# Patient Record
Sex: Male | Born: 1965
Health system: Southern US, Community
[De-identification: ages and names within clinical notes are randomized; demographics above are authoritative.]

## PROBLEM LIST (undated history)

## (undated) DIAGNOSIS — T7840XA Allergy, unspecified, initial encounter: Secondary | ICD-10-CM

## (undated) DIAGNOSIS — K219 Gastro-esophageal reflux disease without esophagitis: Secondary | ICD-10-CM

## (undated) HISTORY — PX: CERVICAL FUSION: SHX112

## (undated) HISTORY — DX: Allergy, unspecified, initial encounter: T78.40XA

## (undated) HISTORY — DX: Gastro-esophageal reflux disease without esophagitis: K21.9

## (undated) HISTORY — PX: HERNIA REPAIR: SHX51

## (undated) HISTORY — PX: CERVICAL DISCECTOMY: SHX98

---

## 1998-04-27 ENCOUNTER — Ambulatory Visit (HOSPITAL_COMMUNITY): Admission: RE | Admit: 1998-04-27 | Discharge: 1998-04-27 | Payer: Self-pay | Admitting: *Deleted

## 1998-04-27 ENCOUNTER — Encounter: Payer: Self-pay | Admitting: *Deleted

## 1998-07-05 ENCOUNTER — Ambulatory Visit (HOSPITAL_COMMUNITY): Admission: RE | Admit: 1998-07-05 | Discharge: 1998-07-05 | Payer: Self-pay | Admitting: Neurosurgery

## 1998-07-05 ENCOUNTER — Encounter: Payer: Self-pay | Admitting: Neurosurgery

## 1999-02-05 ENCOUNTER — Ambulatory Visit (HOSPITAL_COMMUNITY): Admission: RE | Admit: 1999-02-05 | Discharge: 1999-02-05 | Payer: Self-pay | Admitting: Neurosurgery

## 1999-02-05 ENCOUNTER — Encounter: Payer: Self-pay | Admitting: Neurosurgery

## 1999-04-07 ENCOUNTER — Ambulatory Visit (HOSPITAL_COMMUNITY): Admission: RE | Admit: 1999-04-07 | Discharge: 1999-04-07 | Payer: Self-pay | Admitting: Neurosurgery

## 1999-04-07 ENCOUNTER — Encounter: Payer: Self-pay | Admitting: Neurosurgery

## 1999-04-21 ENCOUNTER — Encounter: Payer: Self-pay | Admitting: Neurosurgery

## 1999-04-21 ENCOUNTER — Ambulatory Visit (HOSPITAL_COMMUNITY): Admission: RE | Admit: 1999-04-21 | Discharge: 1999-04-21 | Payer: Self-pay | Admitting: Neurosurgery

## 1999-05-03 ENCOUNTER — Encounter: Payer: Self-pay | Admitting: Neurosurgery

## 1999-05-03 ENCOUNTER — Ambulatory Visit (HOSPITAL_COMMUNITY): Admission: RE | Admit: 1999-05-03 | Discharge: 1999-05-03 | Payer: Self-pay | Admitting: Neurosurgery

## 1999-07-25 ENCOUNTER — Encounter: Payer: Self-pay | Admitting: Neurosurgery

## 1999-07-27 ENCOUNTER — Encounter: Payer: Self-pay | Admitting: Neurosurgery

## 1999-07-27 ENCOUNTER — Observation Stay (HOSPITAL_COMMUNITY): Admission: RE | Admit: 1999-07-27 | Discharge: 1999-07-28 | Payer: Self-pay | Admitting: Neurosurgery

## 1999-08-25 ENCOUNTER — Encounter: Admission: RE | Admit: 1999-08-25 | Discharge: 1999-08-25 | Payer: Self-pay | Admitting: Neurosurgery

## 1999-08-25 ENCOUNTER — Encounter: Payer: Self-pay | Admitting: Neurosurgery

## 1999-10-04 ENCOUNTER — Encounter: Admission: RE | Admit: 1999-10-04 | Discharge: 1999-10-04 | Payer: Self-pay | Admitting: Neurosurgery

## 1999-10-04 ENCOUNTER — Encounter: Payer: Self-pay | Admitting: Neurosurgery

## 1999-12-04 ENCOUNTER — Ambulatory Visit (HOSPITAL_COMMUNITY): Admission: RE | Admit: 1999-12-04 | Discharge: 1999-12-04 | Payer: Self-pay | Admitting: Neurosurgery

## 1999-12-04 ENCOUNTER — Encounter: Payer: Self-pay | Admitting: Neurosurgery

## 2000-07-03 ENCOUNTER — Encounter: Admission: RE | Admit: 2000-07-03 | Discharge: 2000-07-03 | Payer: Self-pay | Admitting: Neurosurgery

## 2000-07-03 ENCOUNTER — Encounter: Payer: Self-pay | Admitting: Neurosurgery

## 2000-11-03 ENCOUNTER — Encounter: Payer: Self-pay | Admitting: Neurosurgery

## 2000-11-03 ENCOUNTER — Ambulatory Visit (HOSPITAL_COMMUNITY): Admission: RE | Admit: 2000-11-03 | Discharge: 2000-11-03 | Payer: Self-pay | Admitting: Neurosurgery

## 2001-05-06 ENCOUNTER — Ambulatory Visit (HOSPITAL_COMMUNITY): Admission: RE | Admit: 2001-05-06 | Discharge: 2001-05-06 | Payer: Self-pay | Admitting: *Deleted

## 2001-05-06 ENCOUNTER — Encounter: Payer: Self-pay | Admitting: *Deleted

## 2001-09-24 ENCOUNTER — Encounter: Payer: Self-pay | Admitting: Family Medicine

## 2001-09-24 ENCOUNTER — Ambulatory Visit (HOSPITAL_COMMUNITY): Admission: RE | Admit: 2001-09-24 | Discharge: 2001-09-24 | Payer: Self-pay | Admitting: Family Medicine

## 2006-08-09 ENCOUNTER — Ambulatory Visit (HOSPITAL_COMMUNITY): Admission: RE | Admit: 2006-08-09 | Discharge: 2006-08-09 | Payer: Self-pay | Admitting: Neurological Surgery

## 2006-08-30 ENCOUNTER — Ambulatory Visit (HOSPITAL_COMMUNITY): Admission: RE | Admit: 2006-08-30 | Discharge: 2006-08-30 | Payer: Self-pay | Admitting: Neurological Surgery

## 2007-06-14 ENCOUNTER — Encounter: Admission: RE | Admit: 2007-06-14 | Discharge: 2007-06-14 | Payer: Self-pay | Admitting: Neurological Surgery

## 2007-07-17 ENCOUNTER — Ambulatory Visit (HOSPITAL_COMMUNITY): Admission: RE | Admit: 2007-07-17 | Discharge: 2007-07-18 | Payer: Self-pay | Admitting: Neurological Surgery

## 2007-08-19 ENCOUNTER — Encounter: Admission: RE | Admit: 2007-08-19 | Discharge: 2007-08-19 | Payer: Self-pay | Admitting: Neurological Surgery

## 2007-10-21 ENCOUNTER — Encounter: Admission: RE | Admit: 2007-10-21 | Discharge: 2007-10-21 | Payer: Self-pay | Admitting: Neurological Surgery

## 2008-01-20 ENCOUNTER — Encounter: Admission: RE | Admit: 2008-01-20 | Discharge: 2008-01-20 | Payer: Self-pay | Admitting: Neurological Surgery

## 2008-02-12 IMAGING — RF DG CERVICAL SPINE 1V
1 series · 2 of 2 positions shown · non-contrast
Comparison: none

HISTORY: Disc herniation, C6-C7 ACDF

CERVICAL SPINE ONE VIEW:
Single crosstable lateral digital C-arm fluoroscopic image obtained
intraoperatively.
Prior fusion of C5-C6.
Anterior plate and screws identified at fusion of C6-C7.
Bone plug in place.
Minimal spur formation C4-C5.
T1 not visualized.

[Series 1: run · 2 of 2 slices shown]
[im 1/2]
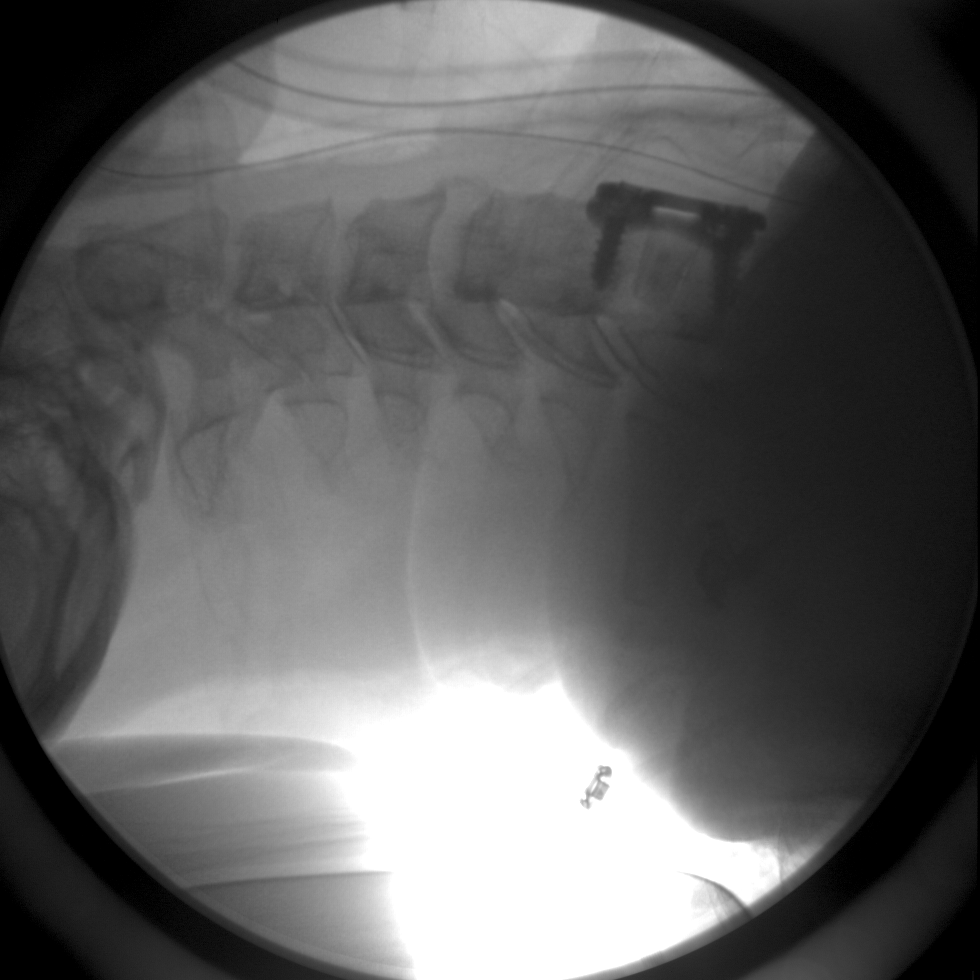
[im 2/2]
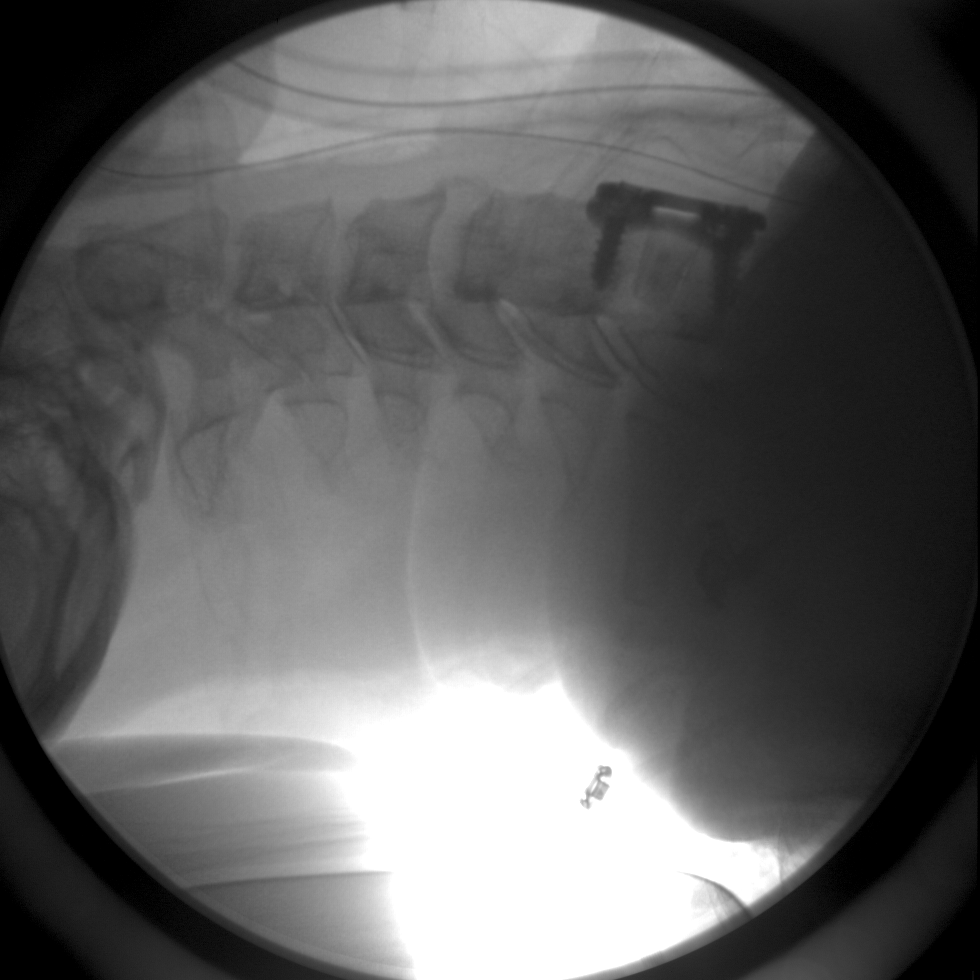

[2 of 2 positions shown; findings below may reference images not displayed]

IMPRESSION: Anterior fusion C6-C7.

## 2009-06-14 ENCOUNTER — Encounter: Admission: RE | Admit: 2009-06-14 | Discharge: 2009-06-14 | Payer: Self-pay | Admitting: Surgery

## 2009-07-14 ENCOUNTER — Ambulatory Visit (HOSPITAL_COMMUNITY): Admission: RE | Admit: 2009-07-14 | Discharge: 2009-07-14 | Payer: Self-pay | Admitting: Surgery

## 2009-08-09 ENCOUNTER — Ambulatory Visit (HOSPITAL_COMMUNITY): Admission: RE | Admit: 2009-08-09 | Discharge: 2009-08-09 | Payer: Self-pay | Admitting: Surgery

## 2010-06-09 ENCOUNTER — Ambulatory Visit: Payer: Self-pay | Admitting: General Practice

## 2010-09-15 LAB — BASIC METABOLIC PANEL
Chloride: 103 mEq/L (ref 96–112)
GFR calc non Af Amer: >60 mL/min (ref >60)
Glucose, Bld: 91 mg/dL (ref 70–99)

## 2010-09-15 LAB — CBC
HCT: 40.7 % (ref 39.0–52.0)
MCV: 86.9 fL (ref 78.0–100.0)
Platelets: 212 10*3/uL (ref 150–400)
RBC: 4.68 MIL/uL (ref 4.22–5.81)
WBC: 5.1 10*3/uL (ref 4.0–10.5)

## 2010-09-15 LAB — DIFFERENTIAL
Basophils Relative: 1 % (ref 0–1)
Lymphocytes Relative: 28 % (ref 12–46)
Lymphs Abs: 1.5 10*3/uL (ref 0.7–4.0)
Monocytes Absolute: 0.3 10*3/uL (ref 0.1–1.0)
Monocytes Relative: 6 % (ref 3–12)
Neutrophils Relative %: 64 % (ref 43–77)

## 2010-11-08 NOTE — Op Note (Signed)
NAMESABRI, TEAL NO.:  [id number]   MEDICAL RECORD NO.:  [id number]          PATIENT TYPE:  AMB   LOCATION:  SDS                          FACILITY:  [facility]   PHYSICIAN:  Tia Alert, MD     DATE OF BIRTH:  08-30-1965   DATE OF PROCEDURE:  07/17/2007  DATE OF DISCHARGE:                               OPERATIVE REPORT   PREOPERATIVE DIAGNOSIS:  Adjacent level spondylosis with neural  foraminal stenosis C6-C7 status post ACDF with plating C5-C6 and C7-T1.   POSTOPERATIVE DIAGNOSIS:  Adjacent level spondylosis with neural  foraminal stenosis C6-C7 status post ACDF with plating C5-C6 and C7-T1.   PROCEDURE:  1. Cervical re-exploration with removal of anterior cervical hardware      C5-C6 and C7-T1.  2. Anterior cervical discectomy C6-C7 for central canal nerve root      decompression.  3. Anterior cervical arthrodesis C6-C7 utilizing a 7 mm      corticocancellous allograft.  4. Anterior cervical plating C6-C7 utilizing a 27 mm Atlantis Venture      plate.   SURGEON:  Tia Alert, M.D.   ASSISTANT:  Donalee Citrin, M.D.   ANESTHESIA:  General endotracheal.   COMPLICATIONS:  None apparent.   INDICATIONS FOR PROCEDURE:  Mr. Force is a pleasant 45 year old  gentleman who underwent two previous ACDs at C5-C6 and at C7-T1 through  a left sided approach years ago with placement of CSLP plates.  He had  adjacent level spondylosis at C6-C7 and complained of neck pain with  left greater than right arm pain.  He had a CT myelogram which showed  cutoff of the nerve root sleeve at C6-C7 on the left with spondylosis at  that level and some central canal stenosis.  I recommended an anterior  cervical re-exploration with removal of the previous plates and ACDF  with plating at C6-C7.  He understood the risks, benefits, and expected  outcome, and wished to proceed.   DESCRIPTION OF PROCEDURE:  The patient was taken to operating room and  after induction of adequate  generalized endotracheal anesthesia, he was  placed in a supine position.  His left anterior cervical region was  prepped with DuraPrep and draped in the usual sterile fashion.  5 mL  local anesthesia was injected and a transverse incision was made between  the old incision and carried down to the with platysma which was  elevated, opened and undermined with Metzenbaum scissors.  I then  dissected in a plane medial to the sternocleidomastoid muscle and  internal carotid artery and lateral to the trachea and esophagus to  expose the old plates at Q0-H4 and C7-T1.  These were removed by  removing the locking screws and then the screws, themselves, and then  picking the plates with hemostats.  We then incised the disc space at C6-  C7.  We took down the longus colli muscles and placed the Shadowline  retractors under these to expose C6-C7.  The initial discectomy was done  with pituitary rongeurs and curved Karlin curettes.  We then used the  high speed drill  to drill the endplates to prepare for later  arthrodesis.  We drilled down to the level of the posterior longitudinal  ligament.  The operating microscope was brought onto the field.  We then  opened the posterior longitudinal ligament with a nerve hook and removed  it in a circumferential fashion while undercutting the bodies of C6 and  C7. Bilateral foraminotomies were performed. The nerve roots had a low  takeoff and followed the pedicle.  Therefore, we marched along the  superior endplate of C7, identified the pedicles, bilaterally and then  marched along the nerve roots distally into the foramina.  We had very  wide generous foraminotomies and long to expansile areas of the nerve  root visual to [place].  We then palpated with the nerve hook into the  foramina.  It was completely open and free.  We, therefore, irrigated  with saline solution.  We inspected our central decompression and it  looked quite good.  The dura was no longer  pushed away from [place]. We had  undercut the bodies to decompress the central canal.  We then measured  the interspace to be 7 mm and placed a 7 mm corticocancellous allograft  in the interspace at C6-C7.  We then used 27 mm Atlantis Venture plate  and placed two Rescue screws into the vertebral bodies of C6 and drilled  two new holes in C7 and placed 13 mm variable angle screws there.  Once  we achieved fixation, we irrigated with saline solution containing  bacitracin, dried all bleeding points with bipolar cautery, and placed a  7 flat Blake drain through a separate stab incision and once meticulous  hemostasis was achieved, closed the platysma with 3-0 Vicryl, closed the  subcuticular tissue with 3-0 Vicryl, and closed the skin with Benzoin  and Steri-Strips.  The drapes were removed.  A sterile dressing was  applied.  The patient was awakened from general anesthesia and  transferred to the recovery room in stable condition.  At the end of the  procedure, all sponge, needle and instrument counts were correct.      Tia Alert, MD  Electronically Signed     DSJ/MEDQ  D:  07/17/2007  T:  07/17/2007  Job:  [id number]

## 2010-11-11 NOTE — H&P (Signed)
[place]. [facility]  Patient:    Bryan Mccann                         MRN: [id number] Proc. Date: 07/27/99 Adm. Date:  [id number] Attending:  Coletta Memos                         History and Physical  DIAGNOSES:  1. Displaced disc, C7-T1 left.             2. Left C8 radiculopathy.  HISTORY OF PRESENT ILLNESS:   Bryan Mccann is a 45 year old gentleman who presented to me originally for pain in his left upper extremity along with numbness and occasional weakness in November 1999.  Initial MRI showed that he had a large herniated disc at C5-6 on the right side but nothing which was so impressive on the left side as to cause his pain.  He had no right-sided pain initially and then started to have right-sided pain.  For that reason I brought him to the hospital on July 05, 1998 and performed an anterior cervical diskectomy, fusion of C5-6 ith allograft and Synthes plating.  Postoperatively, he did actually quite well and the left arm pain also went away.  He returned to me, however, in October and stated that he was having pain again in his left upper extremity.  Repeat MRI done showed a very small disc herniation on the left side at C7-T1.  He had numbness in his  entire hand and pain which radiated down the neck, back of his arm and forearm, and into the hand.  What we then decided that he should undergo epidural steroid injections of the C8 nerve root.  Although they did not provide great relief, they did elicit the exact pain which he was complaining of.  For that reason I felt hat we could isolate to a very high degree of probability that the pain was from the C8 nerve root and that the small disc herniation was the cause of it.  I, therefore, recommended and he agreed to undergo an anterior cervical diskectomy and fusion at C7-T1.  PAST MEDICAL HISTORY:  Significant only for right upper extremity pain and anterior cervical diskectomy  at C5-6 done July 05, 1998.  ALLERGIES:  No known drug allergies.  REVIEW OF SYSTEMS:  Positive only for the neck and left upper extremity pain. Denies constitutional, eye, ear, nose, throat, mouth, cardiovascular, respiratory, genitourinary, skin, neurologic, psychiatric, endocrinologic, hematologic, or allergic problems.  FAMILY HISTORY:  Mother age 10 is in good health.  Father is age 83 and in good  health.  Hypertension and heart disease present in the family history.  SOCIAL HISTORY:  He does not smoke, does drink socially.  He is 5 feet 10 inches tall and is employed as a IT sales professional.  He is married with children.  PHYSICAL EXAMINATION:  NEUROLOGICAL EXAM:  Bryan Mccann had full strength in the upper extremities. Reflexes 2+ in the upper extremities and lower extremities.  Muscle tone, bulk nd coordination normal.  Gait was normal.  Cranial nerves II through XII were examined and normal.  NECK:  He had no cervical masses or bruits.  LUNGS:  Lung fields were clear.  HEART:  Regular rate and rhythm.  ABDOMEN:  Soft and nontender.  PLAN:  Bryan Mccann will be admitted today and taken to the operating room for an  anterior cervical diskectomy and fusion  of C7-T1.  Risks of the procedure including bleeding, infection, no pain relief, worsening of pain, need for further operations, fusion failure, hardware failure, nerve root damage were explained. He understands and wishes to proceed. DD:  07/27/99 TD:  07/27/99 Job: [id number]

## 2010-11-11 NOTE — Op Note (Signed)
[place]. [facility]  Patient:    Bryan Mccann                         MRN: [id number] Proc. Date: 07/27/99 Adm. Date:  [id number] Attending:  Coletta Memos                           Operative Report  PREOPERATIVE DIAGNOSES: 1. Displaced disk C7-T1 left side. 2. Left C8 radiculopathy.  POSTOPERATIVE DIAGNOSES: 1. Displaced disk C7-T1 left side. 2. Left C8 radiculopathy.  PROCEDURE:  Anterior cervical diskectomy C7-T1, anterior cervical arthrodesis C7-T1 with anterior plating and allograft 16 mm small stature Synthes plate used with  14 mm screws.  COMPLICATIONS:  None.  SURGEON:  Kyle L. Franky Macho, M.D.  ASSISTANT:  Danae Orleans. Venetia Maxon, M.D.  INDICATIONS:  Bryan Mccann is a 45 year old gentleman, who presents with pain n his left upper extremity, which he has had on an off and on basis now for approximately a year.  He has a very small disk space ______ C7-T1.  Nerve root  injections identified his ______ as being in a ______ distribution and caused the C8 radiculopathy.  I recommended and he agreed to undergo an anterior cervical diskectomy and fusion.  OPERATIVE NOTE:  Bryan Mccann was brought to the operating room.  He was intubated, placed under general anesthesia without difficulty.  He was placed in slight extension with 10 pounds of cervical traction using a chin strap.  His neck was  then prepped.  I infiltrated incision with 0.5% lidocaine, 1:200,000 strength epinephrine.  He was draped in a sterile fashion.  I opened the skin with a 10 blade starting in the midline and taken to the medial border of the sternocleidomastoid about a fingerbreadth above the sternal notch.   I took this to the platysma.  I opened the platysma in a horizontal fashion.  I controlled bleeding in the subcutaneous tissues with bipolar cautery.  After opening the platysma, I dissected both rostrally and caudally to create more room above the  platysma.  Then,  identified the sternocleidomastoid.  I dissected between the sternocleidomastoid and the medial strap muscles.  I created a plane going medial to the carotid artery.  I was able to palpate the anterior portion of the cervical spine.  Using a peanut, I removed soft tissue.  Placed hand-held retractors and a needle into the disk space there.  The x-ray showed the needle to be at C7-T1. I then proceeded to reflect the longus colli muscles laterally and both the right and left sides.  Placed self-retaining retractor at C7-T1.  I placed two distraction pins, one at C7 and one in T1 and used the pin distractor.  I opened the disk space with a #15 blade, removed disk material in a progressive fashion with curets and pituitary rongeurs.  I drilled out some of the bony prominences and osteophytes  present at that level.  I removed disk material and did remove a free fragment f disk at the C7-T1 disk space on the left side.  Irrigated the wound.  After satisfied with myself that the nerve roots had been decompressed by removing the posterior longitudinal ligament and the chosen of thecal sac, I then placed a 7 mm allograft into the disk space.  Placed 15 mm plate with two screws in C7, two screws in T1.  Took another x-ray, which confirmed my location at C7-T1.  Irrigated the wound.  I then closed the wound in layered fashion reapproximating the platysma with Vicryl sutures, the skin with Steri-Strips.  Patient had a sterile dressing applied.  Bryan Mccann was awakened and was moving all extremities well.  The weight was taken off prior to placing the plate. DD:  07/27/99 TD:  07/27/99 Job: [id number]

## 2010-11-11 NOTE — Discharge Summary (Signed)
[place]. [facility]  Patient:    Bryan Mccann                         MRN: [id number] Adm. Date:  [id number] Disc. Date: [id number] Attending:  Coletta Memos Dictator:   Mena Goes. Franky Macho, M.D.                           Discharge Summary  ADMISSION DIAGNOSES: 1. Displaced disc, C7-T1, left. 2. Left C8 radiculopathy.  DISCHARGE DIAGNOSES: 1. Displaced disc, C7-T1, left. 2. Left C8 radiculopathy.  DISCHARGE STATUS:  Alive and well.  PROCEDURES:  Anterior cervical diskectomy fusion, C7-T1, with 7 mm Allograft and 16 mm small stature Synthes plate.  COMPLICATIONS:  None.  INDICATIONS:  [name] is a 45 year old gentleman who presented with left upper extremity pain to me in November 1999.  He had a displaced disc at C5-6 which was on the right side.  Initially, he had no right-sided pain, and the left-sided pain did subside slightly.  The right-sided pain started soon after he first came to see me.  I performed an anterior cervical diskectomy and fusion at C5-6 for right upper extremity pain.  Unfortunately, the left-sided pain recurred in the  summer of 2000.  Repeat MRI showed a C7-T1 lesion with a very small disc herniation on that side, left side.  [name] underwent cervical epidural steroid shots.  While the shots did not help with the pain, they did to a reasonable degree of certainty, isolate his pain through the C8 distribution on the left side. Because of that and his lack of responsiveness to the steroids and the findings on the RI, I recommended, and he agreed to undergo, a left C7-T1 anterior cervical diskectomy and fusion.  HOSPITAL COURSE:  [name] was admitted, had his procedure done in an uncomplicated fashion.  Postoperatively, he had 5/5 strength in the upper extremities.  He had clean intact wound without signs of infection at discharge. He was voiding, ambulating, and tolerating a regular diet.  His voice  was normal.  DISCHARGE MEDICATIONS:  [name] will be discharged home on Vicodin for pain medication.  ACTIVITY:  Given instructions of no driving, bending, twisting, or lifting.  FOLLOWUP:  He will be sent home with a return appointment to see me in approximately 2-3 weeks. DD:  07/28/99 TD:  07/28/99 Job: [id number]

## 2010-11-11 NOTE — Op Note (Signed)
NAMERAMIZ, TURPIN NO.:  [id number]   MEDICAL RECORD NO.:  [id number]          PATIENT TYPE:  AMB   LOCATION:  SDS                          FACILITY:  [facility]   PHYSICIAN:  Tia Alert, MD     DATE OF BIRTH:  1965-10-25   DATE OF PROCEDURE:  08/09/2006  DATE OF DISCHARGE:                               OPERATIVE REPORT   PREOPERATIVE DIAGNOSIS:  Lumbar disk herniation, L4-5 on the left, with  a left L5 radiculopathy.   POSTOPERATIVE DIAGNOSIS:  Lumbar disk herniation, L4-5 on the left, with  a left L5 radiculopathy.   PROCEDURE:  Lumbar hemilaminectomy, medial facetectomy and foraminotomy,  L4-5, on left, followed by microdiskectomy at L4-5 on left utilizing  microscopic dissection.   SURGEON:  Marikay Alar, MD   ASSISTANT:  Aliene Beams, MD   ANESTHESIA:  General endotracheal.   COMPLICATIONS:  None apparent.   INDICATIONS FOR PROCEDURE:  Mr. Dykman is a 45 year old male who has a  long history of back pain with radiation into the left lower extremity.  He had an MRI which showed a herniated disk at L4-5 on the left.  He  tried medical management for quite some time without significant relief  including epidural steroid injections; he failed medical management and  I recommended a microdiskectomy at L4-5 on the left side.  He understood  the risks, benefits, and expected outcome and wished to proceed.   DESCRIPTION OF PROCEDURE:  The patient was taken to the operating room  and after induction of adequate generalized endotracheal anesthesia, he  was rolled into the prone position on the Wilson frame and all pressure  points were padded.  His lumbar region was prepped with DuraPrep and  then draped in the usual sterile fashion.  Five milliliters of local  anesthesia were injected and a small dorsal midline incision was made  and carried down to the lumbosacral fascia.  The fascia was opened on  the left side and taken down in a subperiosteal fashion  to expose L4-5  on the left side.  The first intraoperative x-ray actually show [place] to be  at L3-4; therefore, we moved down one level to L4-5 and then used the  high-speed drill and the Kerrison punch to perform a hemilaminectomy,  medial facetectomy and foraminotomy.  The yellow ligament was identified  and opened and removed in a piecemeal fashion to expose the underlying  dura and L5 nerve root.  Once our decompression was complete, we  retracted the nerve root medially and utilizing microscopic dissection,  coagulated the epidural venous vasculature, cut this sharply and then  incised the disk space and performed a thorough intradiskal diskectomy  utilizing pituitary rongeurs and curettes.  Once the diskectomy was  complete, we palpated with a coronary dilator into the midline and into  the foramen to assure adequate decompression; the nerve root was free  and pulsatile.  We then irrigated with saline solution containing  bacitracin, dried all bleeding points with bipolar cautery, lined the  dura with Duragen to prevent epidural scarring and then closed the  fascia  with #1 Vicryl, closing the subcutaneous and cuticular tissues  with 2-0  and 3-0 Vicryl and closed skin with Benzoin and Steri-Strips.  The  drapes were removed and a sterile dressing was applied.  The patient was  awakened from general anesthesia and transported to the recovery room in  stable condition.  At the end of the procedure, all sponge, needle and  instrument counts were correct.      Tia Alert, MD  Electronically Signed     DSJ/MEDQ  D:  08/09/2006  T:  08/09/2006  Job:  [id number]

## 2011-03-17 LAB — BASIC METABOLIC PANEL
BUN: 11
CO2: 29
Calcium: 9.9
Chloride: 101
Creatinine, Ser: 1.15
GFR calc Af Amer: >60
GFR calc non Af Amer: >60
Potassium: 5

## 2011-03-17 LAB — DIFFERENTIAL
Basophils Absolute: 0
Basophils Relative: 1
Eosinophils Absolute: 0
Eosinophils Relative: 1
Lymphocytes Relative: 27
Lymphs Abs: 1.4

## 2011-03-17 LAB — ABO/RH: ABO/RH(D): O POS

## 2011-03-17 LAB — TYPE AND SCREEN: Antibody Screen: NEGATIVE

## 2011-03-17 LAB — CBC: HCT: 38.9 — ABNORMAL LOW

## 2012-02-24 ENCOUNTER — Ambulatory Visit: Payer: Self-pay | Admitting: General Practice

## 2012-11-16 ENCOUNTER — Ambulatory Visit: Payer: Self-pay | Admitting: General Practice

## 2012-12-10 ENCOUNTER — Ambulatory Visit: Payer: Self-pay | Admitting: General Practice

## 2012-12-26 ENCOUNTER — Ambulatory Visit: Payer: Self-pay | Admitting: General Practice

## 2012-12-31 DIAGNOSIS — M47812 Spondylosis without myelopathy or radiculopathy, cervical region: Secondary | ICD-10-CM | POA: Insufficient documentation

## 2013-07-08 DIAGNOSIS — G8929 Other chronic pain: Secondary | ICD-10-CM | POA: Insufficient documentation

## 2013-10-06 DIAGNOSIS — M5126 Other intervertebral disc displacement, lumbar region: Secondary | ICD-10-CM | POA: Insufficient documentation

## 2013-11-03 DIAGNOSIS — M25559 Pain in unspecified hip: Secondary | ICD-10-CM | POA: Insufficient documentation

## 2014-07-06 DIAGNOSIS — Z87898 Personal history of other specified conditions: Secondary | ICD-10-CM | POA: Insufficient documentation

## 2014-07-06 DIAGNOSIS — R195 Other fecal abnormalities: Secondary | ICD-10-CM | POA: Insufficient documentation

## 2014-07-14 ENCOUNTER — Ambulatory Visit: Payer: Self-pay | Admitting: Unknown Physician Specialty

## 2015-03-23 DIAGNOSIS — M961 Postlaminectomy syndrome, not elsewhere classified: Secondary | ICD-10-CM | POA: Insufficient documentation

## 2016-01-20 DIAGNOSIS — L812 Freckles: Secondary | ICD-10-CM | POA: Diagnosis not present

## 2016-01-20 DIAGNOSIS — D1801 Hemangioma of skin and subcutaneous tissue: Secondary | ICD-10-CM | POA: Diagnosis not present

## 2016-01-20 DIAGNOSIS — Z85828 Personal history of other malignant neoplasm of skin: Secondary | ICD-10-CM | POA: Diagnosis not present

## 2016-01-20 DIAGNOSIS — L570 Actinic keratosis: Secondary | ICD-10-CM | POA: Diagnosis not present

## 2016-01-20 DIAGNOSIS — D2272 Melanocytic nevi of left lower limb, including hip: Secondary | ICD-10-CM | POA: Diagnosis not present

## 2016-07-18 DIAGNOSIS — D0462 Carcinoma in situ of skin of left upper limb, including shoulder: Secondary | ICD-10-CM | POA: Diagnosis not present

## 2016-07-18 DIAGNOSIS — L821 Other seborrheic keratosis: Secondary | ICD-10-CM | POA: Diagnosis not present

## 2016-07-18 DIAGNOSIS — L570 Actinic keratosis: Secondary | ICD-10-CM | POA: Diagnosis not present

## 2016-07-18 DIAGNOSIS — D0461 Carcinoma in situ of skin of right upper limb, including shoulder: Secondary | ICD-10-CM | POA: Diagnosis not present

## 2016-07-18 DIAGNOSIS — L812 Freckles: Secondary | ICD-10-CM | POA: Diagnosis not present

## 2016-07-18 DIAGNOSIS — L218 Other seborrheic dermatitis: Secondary | ICD-10-CM | POA: Diagnosis not present

## 2016-07-18 DIAGNOSIS — Z85828 Personal history of other malignant neoplasm of skin: Secondary | ICD-10-CM | POA: Diagnosis not present

## 2016-08-09 ENCOUNTER — Other Ambulatory Visit: Payer: Self-pay | Admitting: Family Medicine

## 2016-08-09 ENCOUNTER — Ambulatory Visit
Admission: RE | Admit: 2016-08-09 | Discharge: 2016-08-09 | Disposition: A | Discharge status: Home / Self Care | Payer: BLUE CROSS/BLUE SHIELD | Source: Ambulatory Visit | Attending: Family Medicine | Admitting: Family Medicine

## 2016-08-09 DIAGNOSIS — M5432 Sciatica, left side: Secondary | ICD-10-CM | POA: Insufficient documentation

## 2016-08-09 DIAGNOSIS — M545 Low back pain: Secondary | ICD-10-CM | POA: Diagnosis not present

## 2016-08-30 DIAGNOSIS — M5116 Intervertebral disc disorders with radiculopathy, lumbar region: Secondary | ICD-10-CM | POA: Diagnosis not present

## 2016-08-30 DIAGNOSIS — M5117 Intervertebral disc disorders with radiculopathy, lumbosacral region: Secondary | ICD-10-CM | POA: Diagnosis not present

## 2016-09-12 DIAGNOSIS — R030 Elevated blood-pressure reading, without diagnosis of hypertension: Secondary | ICD-10-CM | POA: Diagnosis not present

## 2016-09-12 DIAGNOSIS — M5126 Other intervertebral disc displacement, lumbar region: Secondary | ICD-10-CM | POA: Diagnosis not present

## 2016-10-24 DIAGNOSIS — M5126 Other intervertebral disc displacement, lumbar region: Secondary | ICD-10-CM | POA: Diagnosis not present

## 2016-11-13 DIAGNOSIS — M5126 Other intervertebral disc displacement, lumbar region: Secondary | ICD-10-CM | POA: Diagnosis not present

## 2016-11-24 ENCOUNTER — Emergency Department: Payer: BLUE CROSS/BLUE SHIELD

## 2016-11-24 ENCOUNTER — Inpatient Hospital Stay
Admission: EM | Admit: 2016-11-24 | Discharge: 2016-11-25 | DRG: 392 | Disposition: A | Discharge status: Home / Self Care | Payer: BLUE CROSS/BLUE SHIELD | Attending: Specialist | Admitting: Specialist

## 2016-11-24 DIAGNOSIS — E781 Pure hyperglyceridemia: Secondary | ICD-10-CM | POA: Diagnosis not present

## 2016-11-24 DIAGNOSIS — E785 Hyperlipidemia, unspecified: Secondary | ICD-10-CM | POA: Diagnosis not present

## 2016-11-24 DIAGNOSIS — G629 Polyneuropathy, unspecified: Secondary | ICD-10-CM | POA: Diagnosis not present

## 2016-11-24 DIAGNOSIS — K219 Gastro-esophageal reflux disease without esophagitis: Secondary | ICD-10-CM | POA: Diagnosis not present

## 2016-11-24 DIAGNOSIS — M549 Dorsalgia, unspecified: Secondary | ICD-10-CM | POA: Diagnosis not present

## 2016-11-24 DIAGNOSIS — G8929 Other chronic pain: Secondary | ICD-10-CM | POA: Diagnosis present

## 2016-11-24 DIAGNOSIS — I200 Unstable angina: Secondary | ICD-10-CM | POA: Diagnosis not present

## 2016-11-24 DIAGNOSIS — R079 Chest pain, unspecified: Secondary | ICD-10-CM

## 2016-11-24 DIAGNOSIS — M5489 Other dorsalgia: Secondary | ICD-10-CM | POA: Diagnosis present

## 2016-11-24 DIAGNOSIS — R739 Hyperglycemia, unspecified: Secondary | ICD-10-CM | POA: Diagnosis not present

## 2016-11-24 DIAGNOSIS — R0602 Shortness of breath: Secondary | ICD-10-CM | POA: Diagnosis not present

## 2016-11-24 DIAGNOSIS — M25373 Other instability, unspecified ankle: Secondary | ICD-10-CM | POA: Diagnosis present

## 2016-11-24 LAB — LIPASE, BLOOD: LIPASE: 31 U/L (ref 11–51)

## 2016-11-24 LAB — TROPONIN I: Troponin I: <0.03 ng/mL (ref <0.03)

## 2016-11-24 LAB — BASIC METABOLIC PANEL
Anion gap: 10 (ref 5–15)
BUN: 14 mg/dL (ref 6–20)
CALCIUM: 9.8 mg/dL (ref 8.9–10.3)
CO2: 25 mmol/L (ref 22–32)
Chloride: 103 mmol/L (ref 101–111)
Creatinine, Ser: 0.96 mg/dL (ref 0.61–1.24)
GFR calc Af Amer: >60 mL/min (ref >60)
Glucose, Bld: 104 mg/dL — ABNORMAL HIGH (ref 65–99)
Potassium: 3.7 mmol/L (ref 3.5–5.1)
Sodium: 138 mmol/L (ref 135–145)

## 2016-11-24 LAB — HEPATIC FUNCTION PANEL
ALT: 37 U/L (ref 17–63)
AST: 30 U/L (ref 15–41)
Albumin: 4.8 g/dL (ref 3.5–5.0)
Alkaline Phosphatase: 38 U/L (ref 38–126)
BILIRUBIN TOTAL: 1.1 mg/dL (ref 0.3–1.2)
Total Protein: 7.9 g/dL (ref 6.5–8.1)

## 2016-11-24 LAB — CBC
HEMATOCRIT: 40.1 % (ref 40.0–52.0)
Hemoglobin: 13.7 g/dL (ref 13.0–18.0)
MCH: 28.8 pg (ref 26.0–34.0)
MCHC: 34.2 g/dL (ref 32.0–36.0)
MCV: 84.2 fL (ref 80.0–100.0)
Platelets: 194 10*3/uL (ref 150–440)
RBC: 4.77 MIL/uL (ref 4.40–5.90)
RDW: 14 % (ref 11.5–14.5)
WBC: 6.7 10*3/uL (ref 3.8–10.6)

## 2016-11-24 MED ORDER — PREGABALIN 50 MG PO CAPS
150.0000 mg | ORAL_CAPSULE | Freq: Three times a day (TID) | ORAL | Status: DC
  Administered 2016-11-24 – 2016-11-25 (×2): 150 mg via ORAL
  Filled 2016-11-24 (×2): qty 3

## 2016-11-24 MED ORDER — ASPIRIN 81 MG PO CHEW
324.0000 mg | CHEWABLE_TABLET | Freq: Once | ORAL | Status: AC
  Administered 2016-11-24: 324 mg via ORAL
  Filled 2016-11-24: qty 4

## 2016-11-24 MED ORDER — SENNOSIDES-DOCUSATE SODIUM 8.6-50 MG PO TABS: 1.0000 | ORAL_TABLET | Freq: Every evening | ORAL | Status: DC | PRN

## 2016-11-24 MED ORDER — ENOXAPARIN SODIUM 40 MG/0.4ML ~~LOC~~ SOLN
40.0000 mg | SUBCUTANEOUS | Status: DC
  Administered 2016-11-24: 40 mg via SUBCUTANEOUS
  Filled 2016-11-24: qty 0.4

## 2016-11-24 MED ORDER — GI COCKTAIL ~~LOC~~
30.0000 mL | Freq: Once | ORAL | Status: AC
  Administered 2016-11-24: 30 mL via ORAL
  Filled 2016-11-24: qty 30

## 2016-11-24 MED ORDER — ONDANSETRON HCL 4 MG PO TABS: 4.0000 mg | ORAL_TABLET | Freq: Four times a day (QID) | ORAL | Status: DC | PRN

## 2016-11-24 MED ORDER — ASPIRIN EC 325 MG PO TBEC
325.0000 mg | DELAYED_RELEASE_TABLET | Freq: Every day | ORAL | Status: DC
  Administered 2016-11-25: 325 mg via ORAL
  Filled 2016-11-24: qty 1

## 2016-11-24 MED ORDER — ONDANSETRON HCL 4 MG/2ML IJ SOLN: 4.0000 mg | Freq: Four times a day (QID) | INTRAMUSCULAR | Status: DC | PRN

## 2016-11-24 MED ORDER — HYDROCODONE-ACETAMINOPHEN 5-325 MG PO TABS: 1.0000 | ORAL_TABLET | ORAL | Status: DC | PRN

## 2016-11-24 MED ORDER — NITROGLYCERIN 0.4 MG SL SUBL
0.4000 mg | SUBLINGUAL_TABLET | Freq: Once | SUBLINGUAL | Status: AC
  Administered 2016-11-24: 0.4 mg via SUBLINGUAL
  Filled 2016-11-24: qty 1

## 2016-11-24 MED ORDER — PANTOPRAZOLE SODIUM 40 MG PO TBEC
40.0000 mg | DELAYED_RELEASE_TABLET | Freq: Every day | ORAL | Status: DC
  Administered 2016-11-24 – 2016-11-25 (×2): 40 mg via ORAL
  Filled 2016-11-24 (×2): qty 1

## 2016-11-24 NOTE — ED Provider Notes (Signed)
[facility] Emergency Department Provider Note   ____________________________________________    I have reviewed the triage vital signs and the nursing notes.   HISTORY  Chief Complaint Chest Pain     HPI Bryan Mccann is a 51 y.o. male who presents with chest pain. Patient reports he developed chest pain yesterday morning was primarily in the epigastric area but also radiated across both sides of his chest and occasionally into his left arm, this did not seem to be related to exertion. He reports it becomes worse when he eats and he develops bloating and burning in his epigastrium. He has a history of heartburn as well. No recent travel. No calf pain or swelling. No shortness of breath. No fevers or chills or cough. No history of PE. Patient resolved last night but became worse this morning when he got up   No past medical history on file.  Patient Active Problem List   Diagnosis Date Noted  . Unstable ankle 11/24/2016  . Unstable angina (HCC) 11/24/2016    No past surgical history on file.  Prior to Admission medications   Medication Sig Start Date End Date Taking? Authorizing Provider  esomeprazole (NEXIUM) 20 MG capsule Take 20 mg by mouth daily. 07/06/14  Yes [provider]  HYDROcodone-acetaminophen (NORCO) 10-325 MG tablet Take 1 tablet by mouth every 6 (six) hours as needed. 10/30/16  Yes [provider]  LYRICA 150 MG capsule Take 150 mg by mouth 3 (three) times daily. 10/30/16  Yes [provider]     Allergies Patient has no known allergies.  No family history on file.  Social History Social History  Substance Use Topics  . Smoking status: Not on file  . Smokeless tobacco: Not on file  . Alcohol use Not on file    Review of Systems  Constitutional: No fever/chills Eyes: No visual changes.  ENT: No sore throat. Cardiovascular: As above Respiratory: Denies shortness of  breath. Gastrointestinal:  No nausea, no vomiting.   Genitourinary: Negative for dysuria. Musculoskeletal: Negative for back pain. Skin: Negative for rash. Neurological: Negative for headaches or weakness   ____________________________________________   PHYSICAL EXAM:  VITAL SIGNS: ED Triage Vitals  Enc Vitals Group     BP --      Pulse --      Resp --      Temp --      Temp src --      SpO2 --      Weight 11/24/16 1317 77.1 kg (170 lb)     Height 11/24/16 1317 1.753 m (5\' 9" )     Head Circumference --      Peak Flow --      Pain Score 11/24/16 1316 3     Pain Loc --      Pain Edu? --      Excl. in GC? --     Constitutional: Alert and oriented. No acute distress. Pleasant and interactive Eyes: Conjunctivae are normal.   Nose: No congestion/rhinnorhea. Mouth/Throat: Mucous membranes are moist.    Cardiovascular: Normal rate, regular rhythm. Grossly normal heart sounds.  Good peripheral circulation. Respiratory: Normal respiratory effort.  No retractions. Lungs CTAB. Gastrointestinal: Soft and nontender. No distention.  No CVA tenderness. Genitourinary: deferred Musculoskeletal: No lower extremity tenderness nor edema.  Warm and well perfused Neurologic:  Normal speech and language. No gross focal neurologic deficits are appreciated.  Skin:  Skin is warm, dry and intact. No rash noted.  Psychiatric: Mood and affect are normal. Speech and behavior are normal.  ____________________________________________   LABS (all labs ordered are listed, but only abnormal results are displayed)  Labs Reviewed  BASIC METABOLIC PANEL - Abnormal; Notable for the following:       Result Value   Glucose, Bld 104 (*)    All other components within normal limits  HEPATIC FUNCTION PANEL - Abnormal; Notable for the following:    Bilirubin, Direct <0.1 (*)    All other components within normal limits  CBC  TROPONIN I  LIPASE, BLOOD    ____________________________________________  EKG  ED ECG REPORT I, Jene Every, the attending physician, personally viewed and interpreted this ECG.  Date: 11/24/2016  Rhythm: normal sinus rhythm QRS Axis: normal Intervals: normal ST/T Wave abnormalities: normal Conduction Disturbances: none Narrative Interpretation: unremarkable  ____________________________________________  RADIOLOGY  Chest x-ray normal ____________________________________________   PROCEDURES  Procedure(s) performed: No    Critical Care performed: No ____________________________________________   INITIAL IMPRESSION / ASSESSMENT AND PLAN / ED COURSE  Pertinent labs & imaging results that were available during my care of the patient were reviewed by me and considered in my medical decision making (see chart for details).  Patient presents with complaints of epigastric and chest pain. EKG troponin normal normal, chest x-ray normal. History of present illness could be consistent with gastritis, we will give GI cocktail and reevaluate  ----------------------------------------- 5:19 PM on 11/24/2016 -----------------------------------------  Patient had little improvement with GI cocktail, we gave aspirin and nitroglycerin and now he feels he may be improving somewhat. We will continue to monitor. I suggested admission to the patient but he is quite hesitant  Patient without any improvement after nitroglycerin, I'll admit him to the hospital for further management    ____________________________________________   FINAL CLINICAL IMPRESSION(S) / ED DIAGNOSES  Final diagnoses:  Chest pain, unspecified type      NEW MEDICATIONS STARTED DURING THIS VISIT:  New Prescriptions   No medications on file     Note:  This document was prepared using Dragon voice recognition software and may include unintentional dictation errors.    Jene Every, MD 11/24/16 [id number]

## 2016-11-24 NOTE — ED Triage Notes (Signed)
chrest pain, heart burn quite often in last few days.  Went to [facility] and then sent here for chest pain.

## 2016-11-24 NOTE — H&P (Signed)
[facility] [place] at [facility]   PATIENT NAME: Bryan Mccann    MR#:  [id number]  DATE OF BIRTH:  02/15/1966  DATE OF ADMISSION:  11/24/2016  PRIMARY CARE PHYSICIAN: System, Pcp Not In   REQUESTING/REFERRING PHYSICIAN: Dr. Cyril Loosen  CHIEF COMPLAINT:   Chest pain HISTORY OF PRESENT ILLNESS:  Bryan Mccann  is a 51 y.o. male with a known history of Chronic back pain from herniated disc who presents with above complaint. Patient reports over the past several weeks he has had on and off chest pain. He states that the chest pain lasts just minutes. It is worse with exertion and eases on its own. The worst cases yesterday when it was 10 out of 10. He is currently chest pain-free. He presents to the emergency room today because he had 2 similar episodes of substernal chest pain radiating to his left arm and jaw with diaphoresis.  EKG is normal sinus rhythm and first set of troponins are negative.  PAST MEDICAL HISTORY:  Back pain due to herniated disc  PAST SURGICAL HISTORY:  None  SOCIAL HISTORY:   Social History  Substance Use Topics  . Smoking status: Not on file  . Smokeless tobacco: Not on file  . Alcohol use Not on file    FAMILY HISTORY:  Brain and skin cancer  DRUG ALLERGIES:  No Known Allergies  REVIEW OF SYSTEMS:   Review of Systems  Constitutional: Negative.  Negative for chills, fever and malaise/fatigue.  HENT: Negative.  Negative for ear discharge, ear pain, hearing loss, nosebleeds and sore throat.   Eyes: Negative.  Negative for blurred vision and pain.  Respiratory: Negative.  Negative for cough, hemoptysis, shortness of breath and wheezing.   Cardiovascular: Positive for chest pain. Negative for palpitations and leg swelling.  Gastrointestinal: Negative.  Negative for abdominal pain, blood in stool, diarrhea, nausea and vomiting.  Genitourinary: Negative.  Negative for dysuria.  Musculoskeletal: Negative.  Negative for back pain.  Skin:  Negative.   Neurological: Negative for dizziness, tremors, speech change, focal weakness, seizures and headaches.  Endo/Heme/Allergies: Negative.  Does not bruise/bleed easily.  Psychiatric/Behavioral: Negative.  Negative for depression, hallucinations and suicidal ideas.    MEDICATIONS AT HOME:   Prior to Admission medications   Medication Sig Start Date End Date Taking? Authorizing Provider  esomeprazole (NEXIUM) 20 MG capsule Take 20 mg by mouth daily. 07/06/14  Yes [provider]  HYDROcodone-acetaminophen (NORCO) 10-325 MG tablet Take 1 tablet by mouth every 6 (six) hours as needed. 10/30/16  Yes [provider]  LYRICA 150 MG capsule Take 150 mg by mouth 3 (three) times daily. 10/30/16  Yes [provider]      VITAL SIGNS:  Height 5\' 9"  (1.753 m), weight 77.1 kg (170 lb).  PHYSICAL EXAMINATION:   Physical Exam  Constitutional: He is oriented to person, place, and time and well-developed, well-nourished, and in no distress. No distress.  HENT:  Head: Normocephalic.  Eyes: No scleral icterus.  Neck: Normal range of motion. Neck supple. No JVD present. No tracheal deviation present.  Cardiovascular: Normal rate, regular rhythm and normal heart sounds.  Exam reveals no gallop and no friction rub.   No murmur heard. Pulmonary/Chest: Effort normal and breath sounds normal. No respiratory distress. He has no wheezes. He has no rales. He exhibits no tenderness.  Abdominal: Soft. Bowel sounds are normal. He exhibits no distension and no mass. There is no tenderness. There is no rebound and no guarding.  Musculoskeletal: Normal range of motion. He exhibits no edema.  Neurological: He is alert and oriented to person, place, and time.  Skin: Skin is warm. No rash noted. No erythema.  Psychiatric: Affect and judgment normal.      LABORATORY PANEL:   CBC  Recent Labs Lab 11/24/16 1319  WBC 6.7  HGB 13.7  HCT 40.1  PLT 194    ------------------------------------------------------------------------------------------------------------------  Chemistries   Recent Labs Lab 11/24/16 1319  NA 138  K 3.7  CL 103  CO2 25  GLUCOSE 104*  BUN 14  CREATININE 0.96  CALCIUM 9.8  AST 30  ALT 37  ALKPHOS 38  BILITOT 1.1   ------------------------------------------------------------------------------------------------------------------  Cardiac Enzymes  Recent Labs Lab 11/24/16 1319  TROPONINI <0.03   ------------------------------------------------------------------------------------------------------------------  RADIOLOGY:  Dg Chest 2 View  Result Date: 11/24/2016 CLINICAL DATA:  Left-sided chest pain and shortness of breath. EXAM: CHEST  2 VIEW COMPARISON:  None. FINDINGS: The lungs are clear wiithout focal pneumonia, edema, pneumothorax or pleural effusion. The cardiopericardial silhouette is within normal limits for size. The visualized bony structures of the thorax are intact. IMPRESSION: No active cardiopulmonary disease. Electronically Signed   By: Kennith CenterEric  Mansell M.D.   On: 11/24/2016 13:50    EKG:   Normal sinus rhythm no ST elevation or depression  IMPRESSION AND PLAN:   51 year old male with no significant past medical history who presents with unstable angina.  1. Unstable angina: Patient will be admitted on telemetry unit. Continue to cycle cardiac enzymes. Start aspirin Consider adding beta blocker Check lipid panel Myoview in a.m. if troponins are negative If troponins become positive then we'll need cartilage in consultation  2. Hyperglycemia: Check hemoglobin A1c  3. Chronic back pain: Continue pain medications    All the records are reviewed and case discussed with ED provider. Management plans discussed with the patient and  He is in agreement  CODE STATUS: full  TOTAL TIME TAKING CARE OF THIS PATIENT: 40 minutes.    Heike Pounds M.D on 11/24/2016 at 6:07 PM  Between  7am to 6pm - Pager - [phone]  After 6pm go to [email or web address] - password [email or web address]  [facility] [facility]  Office  [phone]  CC: Primary care physician; System, Pcp Not In

## 2016-11-24 NOTE — ED Notes (Signed)
MD Kinner at bedside  

## 2016-11-25 ENCOUNTER — Inpatient Hospital Stay: Payer: BLUE CROSS/BLUE SHIELD

## 2016-11-25 DIAGNOSIS — R079 Chest pain, unspecified: Secondary | ICD-10-CM | POA: Diagnosis not present

## 2016-11-25 LAB — LIPID PANEL
CHOL/HDL RATIO: 3.6 ratio
CHOLESTEROL: 234 mg/dL — AB (ref 0–200)
Cholesterol: 238 mg/dL — ABNORMAL HIGH (ref 0–200)
HDL: 73 mg/dL (ref >40)
HDL: 66 mg/dL (ref >40)
LDL CALC: 104 mg/dL — AB (ref 0–99)
LDL Cholesterol: 117 mg/dL — ABNORMAL HIGH (ref 0–99)
TRIGLYCERIDES: 220 mg/dL — AB (ref <150)
TRIGLYCERIDES: 341 mg/dL — AB (ref <150)
Total CHOL/HDL Ratio: 3.2 RATIO
VLDL: 44 mg/dL — ABNORMAL HIGH (ref 0–40)
VLDL: 68 mg/dL — AB (ref 0–40)

## 2016-11-25 LAB — NM MYOCAR MULTI W/SPECT W/WALL MOTION / EF
CHL CUP NUCLEAR SRS: 0
CHL CUP NUCLEAR SSS: 1
CSEPPHR: 115 {beats}/min
LV dias vol: 66 mL (ref 62–150)
LV sys vol: 30 mL
Rest HR: 76 {beats}/min
SDS: 1
TID: 0.95

## 2016-11-25 LAB — TROPONIN I
Troponin I: <0.03 ng/mL (ref <0.03)
Troponin I: <0.03 ng/mL (ref <0.03)

## 2016-11-25 MED ORDER — TECHNETIUM TC 99M TETROFOSMIN IV KIT
32.8900 | PACK | Freq: Once | INTRAVENOUS | Status: AC | PRN
  Administered 2016-11-25: 32.89 via INTRAVENOUS

## 2016-11-25 MED ORDER — TECHNETIUM TC 99M TETROFOSMIN IV KIT
13.0100 | PACK | Freq: Once | INTRAVENOUS | Status: AC | PRN
  Administered 2016-11-25: 13.01 via INTRAVENOUS

## 2016-11-25 MED ORDER — PRAVASTATIN SODIUM 20 MG PO TABS
20.0000 mg | ORAL_TABLET | Freq: Every day | ORAL | 1 refills | Status: DC
Start: 2016-11-25 — End: 2020-07-15

## 2016-11-25 MED ORDER — REGADENOSON 0.4 MG/5ML IV SOLN
0.4000 mg | Freq: Once | INTRAVENOUS | Status: AC
  Administered 2016-11-25: 0.4 mg via INTRAVENOUS

## 2016-11-25 NOTE — Progress Notes (Signed)
Discharge instructions explained to pt and pts spouse/ verbalized an understanding/ iv and tele removed/ RX given to pt/ pt refused wheelchair/ ambulated off unit via wheelchair.

## 2016-11-25 NOTE — Consult Note (Signed)
Bryan MoralesGeorge Marcus Mccann is a 51 y.o. male  [id number]  Primary Cardiologist: Adrian BlackwaterShaukat Jacoria Keiffer Reason for Consultation: Chest pain  HPI: 51 year old white male with a past medical history of hyperlipidemia presented to the hospital with left precordial chest pain associated with shortness of breath and relieved with rest.   Review of Systems: No orthopnea PND or leg swelling   No past medical history on file.  Medications Prior to Admission  Medication Sig Dispense Refill  . esomeprazole (NEXIUM) 20 MG capsule Take 20 mg by mouth daily.    Marland Kitchen. HYDROcodone-acetaminophen (NORCO) 10-325 MG tablet Take 1 tablet by mouth every 6 (six) hours as needed.  0  . LYRICA 150 MG capsule Take 150 mg by mouth 3 (three) times daily.  2     . aspirin EC  325 mg Oral Daily  . enoxaparin (LOVENOX) injection  40 mg Subcutaneous Q24H  . pantoprazole  40 mg Oral Daily  . pregabalin  150 mg Oral TID    Infusions:   No Known Allergies  Social History   Social History  . Marital status: Married    Spouse name: N/A  . Number of children: N/A  . Years of education: N/A   Occupational History  . Not on file.   Social History Main Topics  . Smoking status: Not on file  . Smokeless tobacco: Not on file  . Alcohol use Not on file  . Drug use: Unknown  . Sexual activity: Not on file   Other Topics Concern  . Not on file   Social History Narrative  . No narrative on file    No family history on file.  PHYSICAL EXAM: Vitals:   11/25/16 0455 11/25/16 0735  BP: 109/76 121/81  Pulse: 66 64  Resp: 20 18  Temp: 98.2 F (36.8 C)      Intake/Output Summary (Last 24 hours) at 11/25/16 1004 Last data filed at 11/25/16 0749  Gross per 24 hour  Intake                0 ml  Output                0 ml  Net                0 ml    General:  Well appearing. No respiratory difficulty HEENT: normal Neck: supple. no JVD. Carotids 2+ bilat; no bruits. No lymphadenopathy or thryomegaly  appreciated. Cor: PMI nondisplaced. Regular rate & rhythm. No rubs, gallops or murmurs. Lungs: clear Abdomen: soft, nontender, nondistended. No hepatosplenomegaly. No bruits or masses. Good bowel sounds. Extremities: no cyanosis, clubbing, rash, edema Neuro: alert & oriented x 3, cranial nerves grossly intact. moves all 4 extremities w/o difficulty. Affect pleasant.  NWG:NFAOZECG:Sinus rhythm with no acute changes  Results for orders placed or performed during the hospital encounter of 11/24/16 (from the past 24 hour(s))  Basic metabolic panel     Status: Abnormal   Collection Time: 11/24/16  1:19 PM  Result Value Ref Range   Sodium 138 135 - 145 mmol/L   Potassium 3.7 3.5 - 5.1 mmol/L   Chloride 103 101 - 111 mmol/L   CO2 25 22 - 32 mmol/L   Glucose, Bld 104 (H) 65 - 99 mg/dL   BUN 14 6 - 20 mg/dL   Creatinine, Ser 3.080.96 0.61 - 1.24 mg/dL   Calcium 9.8 8.9 - 65.710.3 mg/dL   GFR calc non Af Amer >60 >60 mL/min  GFR calc Af Amer >60 >60 mL/min   Anion gap 10 5 - 15  CBC     Status: None   Collection Time: 11/24/16  1:19 PM  Result Value Ref Range   WBC 6.7 3.8 - 10.6 K/uL   RBC 4.77 4.40 - 5.90 MIL/uL   Hemoglobin 13.7 13.0 - 18.0 g/dL   HCT 16.1 09.6 - 04.5 %   MCV 84.2 80.0 - 100.0 fL   MCH 28.8 26.0 - 34.0 pg   MCHC 34.2 32.0 - 36.0 g/dL   RDW 40.9 81.1 - 91.4 %   Platelets 194 150 - 440 K/uL  Troponin I     Status: None   Collection Time: 11/24/16  1:19 PM  Result Value Ref Range   Troponin I <0.03 <0.03 ng/mL  Lipase, blood     Status: None   Collection Time: 11/24/16  1:19 PM  Result Value Ref Range   Lipase 31 11 - 51 U/L  Hepatic function panel     Status: Abnormal   Collection Time: 11/24/16  1:19 PM  Result Value Ref Range   Total Protein 7.9 6.5 - 8.1 g/dL   Albumin 4.8 3.5 - 5.0 g/dL   AST 30 15 - 41 U/L   ALT 37 17 - 63 U/L   Alkaline Phosphatase 38 38 - 126 U/L   Total Bilirubin 1.1 0.3 - 1.2 mg/dL   Bilirubin, Direct <7.8 (L) 0.1 - 0.5 mg/dL   Indirect  Bilirubin NOT CALCULATED 0.3 - 0.9 mg/dL  Troponin I     Status: None   Collection Time: 11/24/16  7:14 PM  Result Value Ref Range   Troponin I <0.03 <0.03 ng/mL  Lipid panel     Status: Abnormal   Collection Time: 11/25/16  1:01 AM  Result Value Ref Range   Cholesterol 234 (H) 0 - 200 mg/dL   Triglycerides 295 (H) <150 mg/dL   HDL 73 >62 mg/dL   Total CHOL/HDL Ratio 3.2 RATIO   VLDL 44 (H) 0 - 40 mg/dL   LDL Cholesterol 130 (H) 0 - 99 mg/dL  Troponin I     Status: None   Collection Time: 11/25/16  1:01 AM  Result Value Ref Range   Troponin I <0.03 <0.03 ng/mL  Troponin I     Status: None   Collection Time: 11/25/16  6:55 AM  Result Value Ref Range   Troponin I <0.03 <0.03 ng/mL  Lipid panel     Status: Abnormal   Collection Time: 11/25/16  6:55 AM  Result Value Ref Range   Cholesterol 238 (H) 0 - 200 mg/dL   Triglycerides 865 (H) <150 mg/dL   HDL 66 >78 mg/dL   Total CHOL/HDL Ratio 3.6 RATIO   VLDL 68 (H) 0 - 40 mg/dL   LDL Cholesterol 469 (H) 0 - 99 mg/dL   Dg Chest 2 View  Result Date: 11/24/2016 CLINICAL DATA:  Left-sided chest pain and shortness of breath. EXAM: CHEST  2 VIEW COMPARISON:  None. FINDINGS: The lungs are clear wiithout focal pneumonia, edema, pneumothorax or pleural effusion. The cardiopericardial silhouette is within normal limits for size. The visualized bony structures of the thorax are intact. IMPRESSION: No active cardiopulmonary disease. Electronically Signed   By: Kennith Center M.D.   On: 11/24/2016 13:50     ASSESSMENT AND PLAN:Atypical chest pain with nuclear stress test being done with her EKG portion of the test is normal and the nuclear portion is normal too. Patient can  be discharged with follow-up in the office 1:00 on Monday in my office.  Bryan Mccann A

## 2016-11-25 NOTE — Discharge Summary (Signed)
[facility] - [place] at [facility]   PATIENT NAME: Bryan Mccann    MR#:  [id number]  DATE OF BIRTH:  [birth year]  DATE OF ADMISSION:  11/24/2016 ADMITTING PHYSICIAN: Adrian Saran, MD  DATE OF DISCHARGE: 11/25/2016 11:30 AM  PRIMARY CARE PHYSICIAN: System, Pcp Not In    ADMISSION DIAGNOSIS:  Chest pain, unspecified type [R07.9] Unstable angina (HCC) [I20.0]  DISCHARGE DIAGNOSIS:  Active Problems:   Unstable ankle   Unstable angina (HCC)   SECONDARY DIAGNOSIS:  No past medical history on file.  HOSPITAL COURSE:    51 year old male with past medical history of neuropathy, GERD who was admitted to the hospital for Chest pain.   1. Chest Pain - pt. Was admitted to hospital under observation.  He had 3 sets of cardiac markers which were (-).  - he underwent a nuclear medicine stress test which was negative for any acute abnormality. He continues to have some burning in his chest which is likely related to GERD. He was advised to increase his PPI. He's being discharged home with follow-up with his primary care physician.  2. Hyperlipidemia-this was based off a fasting lipid profile done in the hospital. His total cholesterol is over 200. His triglycerides are elevated. -He was discharged on low-dose Pravachol.  3. GERD-patient will continue his Nexium.  4. Neuropathy-patient will continue his Lyrica.  DISCHARGE CONDITIONS:   Stable  CONSULTS OBTAINED:  Treatment Team:  Laurier Nancy, MD  DRUG ALLERGIES:  No Known Allergies  DISCHARGE MEDICATIONS:   Allergies as of 11/25/2016   No Known Allergies     Medication List    TAKE these medications   esomeprazole 20 MG capsule Commonly known as:  NEXIUM Take 20 mg by mouth daily.   HYDROcodone-acetaminophen 10-325 MG tablet Commonly known as:  NORCO Take 1 tablet by mouth every 6 (six) hours as needed.   LYRICA 150 MG capsule Generic drug:  pregabalin Take 150 mg by mouth 3 (three) times daily.    pravastatin 20 MG tablet Commonly known as:  PRAVACHOL Take 1 tablet (20 mg total) by mouth daily.         DISCHARGE INSTRUCTIONS:   DIET:  Regular diet  DISCHARGE CONDITION:  Stable  ACTIVITY:  Activity as tolerated  OXYGEN:  Home Oxygen: No.   Oxygen Delivery: room air  DISCHARGE LOCATION:  home   If you experience worsening of your admission symptoms, develop shortness of breath, life threatening emergency, suicidal or homicidal thoughts you must seek medical attention immediately by calling 911 or calling your MD immediately  if symptoms less severe.  You Must read complete instructions/literature along with all the possible adverse reactions/side effects for all the Medicines you take and that have been prescribed to you. Take any new Medicines after you have completely understood and accpet all the possible adverse reactions/side effects.   Please note  You were cared for by a hospitalist during your hospital stay. If you have any questions about your discharge medications or the care you received while you were in the hospital after you are discharged, you can call the unit and asked to speak with the hospitalist on call if the hospitalist that took care of you is not available. Once you are discharged, your primary care physician will handle any further medical issues. Please note that NO REFILLS for any discharge medications will be authorized once you are discharged, as it is imperative that you return to your primary care physician (or establish  a relationship with a primary care physician if you do not have one) for your aftercare needs so that they can reassess your need for medications and monitor your lab values.     Today   Still having chest burning.  No shortness of breath/Nausea/vomiting.   VITAL SIGNS:  Blood pressure 122/76, pulse 79, temperature 97.8 F (36.6 C), temperature source Oral, resp. rate 18, height 5\' 9"  (1.753 m), weight 77.1 kg (170  lb), SpO2 98 %.  I/O:   Intake/Output Summary (Last 24 hours) at 11/25/16 1501 Last data filed at 11/25/16 0749  Gross per 24 hour  Intake                0 ml  Output                0 ml  Net                0 ml    PHYSICAL EXAMINATION:  GENERAL:  51 y.o.-year-old patient lying in the bed with no acute distress.  EYES: Pupils equal, round, reactive to light and accommodation. No scleral icterus. Extraocular muscles intact.  HEENT: Head atraumatic, normocephalic. Oropharynx and nasopharynx clear.  NECK:  Supple, no jugular venous distention. No thyroid enlargement, no tenderness.  LUNGS: Normal breath sounds bilaterally, no wheezing, rales,rhonchi. No use of accessory muscles of respiration.  CARDIOVASCULAR: S1, S2 normal. No murmurs, rubs, or gallops.  ABDOMEN: Soft, non-tender, non-distended. Bowel sounds present. No organomegaly or mass.  EXTREMITIES: No pedal edema, cyanosis, or clubbing.  NEUROLOGIC: Cranial nerves II through XII are intact. No focal motor or sensory defecits b/l.  PSYCHIATRIC: The patient is alert and oriented x 3. Good affect.  SKIN: No obvious rash, lesion, or ulcer.   DATA REVIEW:   CBC  Recent Labs Lab 11/24/16 1319  WBC 6.7  HGB 13.7  HCT 40.1  PLT 194    Chemistries   Recent Labs Lab 11/24/16 1319  NA 138  K 3.7  CL 103  CO2 25  GLUCOSE 104*  BUN 14  CREATININE 0.96  CALCIUM 9.8  AST 30  ALT 37  ALKPHOS 38  BILITOT 1.1    Cardiac Enzymes  Recent Labs Lab 11/25/16 0655  TROPONINI <0.03    Microbiology Results  No results found for this or any previous visit.  RADIOLOGY:  Dg Chest 2 View  Result Date: 11/24/2016 CLINICAL DATA:  Left-sided chest pain and shortness of breath. EXAM: CHEST  2 VIEW COMPARISON:  None. FINDINGS: The lungs are clear wiithout focal pneumonia, edema, pneumothorax or pleural effusion. The cardiopericardial silhouette is within normal limits for size. The visualized bony structures of the thorax are  intact. IMPRESSION: No active cardiopulmonary disease. Electronically Signed   By: Kennith CenterEric  Mansell M.D.   On: 11/24/2016 13:50   Nm Myocar Multi W/spect W/wall Motion / Ef  Result Date: 11/25/2016  There was no ST segment deviation noted during stress.  The study is normal.  This is a low risk study.  The left ventricular ejection fraction is mildly decreased (45-54%).       Management plans discussed with the patient, family and they are in agreement.  CODE STATUS:     Code Status Orders        Start     Ordered   11/24/16 1902  Full code  Continuous     11/24/16 1901    Code Status History    Date Active Date Inactive Code Status Order  ID Comments User Context   This patient has a current code status but no historical code status.      TOTAL TIME TAKING CARE OF THIS PATIENT: 40 minutes.    Houston Siren M.D on 11/25/2016 at 3:01 PM  Between 7am to 6pm - Pager - [phone]  After 6pm go to [email or web address] - Social research officer, government  [facility] [place] [facility]  Office  [phone]  CC: Primary care physician; System, Pcp Not In

## 2016-11-26 LAB — HEMOGLOBIN A1C
HEMOGLOBIN A1C: 5.3 % (ref 4.8–5.6)
Mean Plasma Glucose: 105 mg/dL

## 2016-11-26 LAB — HIV ANTIBODY (ROUTINE TESTING W REFLEX): HIV SCREEN 4TH GENERATION: NONREACTIVE

## 2016-11-27 DIAGNOSIS — E782 Mixed hyperlipidemia: Secondary | ICD-10-CM | POA: Diagnosis not present

## 2016-11-27 DIAGNOSIS — R079 Chest pain, unspecified: Secondary | ICD-10-CM | POA: Diagnosis not present

## 2016-11-27 DIAGNOSIS — I2510 Atherosclerotic heart disease of native coronary artery without angina pectoris: Secondary | ICD-10-CM | POA: Diagnosis not present

## 2016-11-27 DIAGNOSIS — R072 Precordial pain: Secondary | ICD-10-CM | POA: Diagnosis not present

## 2016-11-27 DIAGNOSIS — R0602 Shortness of breath: Secondary | ICD-10-CM | POA: Diagnosis not present

## 2016-12-05 DIAGNOSIS — R079 Chest pain, unspecified: Secondary | ICD-10-CM | POA: Diagnosis not present

## 2016-12-12 DIAGNOSIS — M961 Postlaminectomy syndrome, not elsewhere classified: Secondary | ICD-10-CM | POA: Diagnosis not present

## 2016-12-12 DIAGNOSIS — M5126 Other intervertebral disc displacement, lumbar region: Secondary | ICD-10-CM | POA: Diagnosis not present

## 2016-12-12 DIAGNOSIS — G8929 Other chronic pain: Secondary | ICD-10-CM | POA: Diagnosis not present

## 2016-12-12 DIAGNOSIS — M542 Cervicalgia: Secondary | ICD-10-CM | POA: Diagnosis not present

## 2016-12-26 DIAGNOSIS — K210 Gastro-esophageal reflux disease with esophagitis: Secondary | ICD-10-CM | POA: Diagnosis not present

## 2016-12-26 DIAGNOSIS — R079 Chest pain, unspecified: Secondary | ICD-10-CM | POA: Diagnosis not present

## 2017-01-08 DIAGNOSIS — Z6826 Body mass index (BMI) 26.0-26.9, adult: Secondary | ICD-10-CM | POA: Diagnosis not present

## 2017-01-08 DIAGNOSIS — R030 Elevated blood-pressure reading, without diagnosis of hypertension: Secondary | ICD-10-CM | POA: Diagnosis not present

## 2017-01-08 DIAGNOSIS — M5126 Other intervertebral disc displacement, lumbar region: Secondary | ICD-10-CM | POA: Diagnosis not present

## 2017-01-23 DIAGNOSIS — L281 Prurigo nodularis: Secondary | ICD-10-CM | POA: Diagnosis not present

## 2017-01-23 DIAGNOSIS — Z85828 Personal history of other malignant neoplasm of skin: Secondary | ICD-10-CM | POA: Diagnosis not present

## 2017-01-23 DIAGNOSIS — L570 Actinic keratosis: Secondary | ICD-10-CM | POA: Diagnosis not present

## 2017-01-23 DIAGNOSIS — D1801 Hemangioma of skin and subcutaneous tissue: Secondary | ICD-10-CM | POA: Diagnosis not present

## 2017-01-23 DIAGNOSIS — L814 Other melanin hyperpigmentation: Secondary | ICD-10-CM | POA: Diagnosis not present

## 2017-01-23 DIAGNOSIS — L820 Inflamed seborrheic keratosis: Secondary | ICD-10-CM | POA: Diagnosis not present

## 2017-03-12 DIAGNOSIS — M961 Postlaminectomy syndrome, not elsewhere classified: Secondary | ICD-10-CM | POA: Diagnosis not present

## 2017-03-12 DIAGNOSIS — M5126 Other intervertebral disc displacement, lumbar region: Secondary | ICD-10-CM | POA: Diagnosis not present

## 2017-03-12 DIAGNOSIS — R030 Elevated blood-pressure reading, without diagnosis of hypertension: Secondary | ICD-10-CM | POA: Diagnosis not present

## 2017-03-12 DIAGNOSIS — Z6825 Body mass index (BMI) 25.0-25.9, adult: Secondary | ICD-10-CM | POA: Diagnosis not present

## 2017-03-29 DIAGNOSIS — M961 Postlaminectomy syndrome, not elsewhere classified: Secondary | ICD-10-CM | POA: Diagnosis not present

## 2017-06-05 DIAGNOSIS — Z6827 Body mass index (BMI) 27.0-27.9, adult: Secondary | ICD-10-CM | POA: Diagnosis not present

## 2017-06-05 DIAGNOSIS — M5136 Other intervertebral disc degeneration, lumbar region: Secondary | ICD-10-CM | POA: Insufficient documentation

## 2017-06-05 DIAGNOSIS — R030 Elevated blood-pressure reading, without diagnosis of hypertension: Secondary | ICD-10-CM | POA: Diagnosis not present

## 2017-06-05 DIAGNOSIS — M5126 Other intervertebral disc displacement, lumbar region: Secondary | ICD-10-CM | POA: Diagnosis not present

## 2017-06-20 DIAGNOSIS — M48061 Spinal stenosis, lumbar region without neurogenic claudication: Secondary | ICD-10-CM | POA: Diagnosis not present

## 2017-06-20 DIAGNOSIS — M5126 Other intervertebral disc displacement, lumbar region: Secondary | ICD-10-CM | POA: Diagnosis not present

## 2017-06-29 DIAGNOSIS — G8929 Other chronic pain: Secondary | ICD-10-CM | POA: Diagnosis not present

## 2017-06-29 DIAGNOSIS — Z6826 Body mass index (BMI) 26.0-26.9, adult: Secondary | ICD-10-CM | POA: Diagnosis not present

## 2017-06-29 DIAGNOSIS — R030 Elevated blood-pressure reading, without diagnosis of hypertension: Secondary | ICD-10-CM | POA: Diagnosis not present

## 2017-07-26 DIAGNOSIS — L281 Prurigo nodularis: Secondary | ICD-10-CM | POA: Diagnosis not present

## 2017-07-26 DIAGNOSIS — L820 Inflamed seborrheic keratosis: Secondary | ICD-10-CM | POA: Diagnosis not present

## 2017-07-26 DIAGNOSIS — D1801 Hemangioma of skin and subcutaneous tissue: Secondary | ICD-10-CM | POA: Diagnosis not present

## 2017-07-26 DIAGNOSIS — Z85828 Personal history of other malignant neoplasm of skin: Secondary | ICD-10-CM | POA: Diagnosis not present

## 2017-07-26 DIAGNOSIS — L570 Actinic keratosis: Secondary | ICD-10-CM | POA: Diagnosis not present

## 2017-07-26 DIAGNOSIS — L812 Freckles: Secondary | ICD-10-CM | POA: Diagnosis not present

## 2017-08-09 DIAGNOSIS — R030 Elevated blood-pressure reading, without diagnosis of hypertension: Secondary | ICD-10-CM | POA: Diagnosis not present

## 2017-08-09 DIAGNOSIS — Z6826 Body mass index (BMI) 26.0-26.9, adult: Secondary | ICD-10-CM | POA: Diagnosis not present

## 2017-08-09 DIAGNOSIS — M5126 Other intervertebral disc displacement, lumbar region: Secondary | ICD-10-CM | POA: Diagnosis not present

## 2017-10-01 DIAGNOSIS — R200 Anesthesia of skin: Secondary | ICD-10-CM | POA: Insufficient documentation

## 2017-10-02 DIAGNOSIS — M961 Postlaminectomy syndrome, not elsewhere classified: Secondary | ICD-10-CM | POA: Diagnosis not present

## 2017-10-18 ENCOUNTER — Encounter: Payer: BLUE CROSS/BLUE SHIELD | Attending: Psychology | Admitting: Psychology

## 2017-10-18 ENCOUNTER — Encounter: Payer: Self-pay | Admitting: Psychology

## 2017-10-18 DIAGNOSIS — G894 Chronic pain syndrome: Secondary | ICD-10-CM | POA: Diagnosis not present

## 2017-10-18 NOTE — Progress Notes (Signed)
Neuropsychological Consultation   Patient:   Bryan Mccann   DOB:   [birth year]  MR Number:  [id number]  Location:  [facility] FOR PAIN AND REHABILITATIVE MEDICINE [facility] PHYSICAL MEDICINE AND REHABILITATION [address] [place] [address] [id number] [place] [address] Dept: [phone]           Date of Service:   10/18/2017  Start Time:   9 AM End Time:   10 AM  Provider/Observer:  Arley Phenix, Psy.D.       Clinical Neuropsychologist       Billing Code/Service: [phone] 4 Units  Chief Complaint:    Bryan Mccann is a 52 year old Caucasian male who was referred by Dr. Ollen Bowl for psychological evaluation as part of the standard protocol for consideration for spinal cord stimulator trials and possible permanent implantation.  The patient reports that his primary difficulties that he has been coping with have to do with pain in his legs, lower back as well as weakness in his arms.  The patient reports that he has had back surgery in the past resulting in a discectomy as well as for cervical disc/neck surgeries.  Patient reports that over time he has experienced more more pain continues to have 2 or 3 bulging disc that are problematic.  Reason for Service:  Bryan Mccann was referred for psychological evaluation as part of standard protocols for work-up for possible spinal cord stimulator implantation.  The patient has had prior back surgeries one by Dr. Yetta Barre as well as for cervical neck surgeries.  Patient continues to have significant pain in his legs, lower back and weakness in his arms and hands.  The patient reports that the pain is been progressively worsening over the past couple of years.  The patient is receiving injections in his back are helpful but they do not sustain the patient reports that Dr. Yetta Barre did a discectomy last year the patient is gone out of work on disability.  The patient continues to have pain and weakness which is helped by the  injections.  The patient reports that he has talked with the neurosurgeon who feels like another surgery may not be the proper way to go and has recommended consideration for spinal cord stimulator.  The patient has been informed about potential benefits and risk of spinal cord stimulator devices.  The patient denies any significant stressors in his life and while his disability income is lower than what his actual pain he had been he is not in any major financial stressors.  Patient really wants to be able to return to work.  Patient denies any significant issues of depression, anxiety or other psychiatric patient does acknowledge becoming more agitated when his pain is most severe.  Patient reports that his sleep patterns are variable at times and are highly correlated with the level of pain he is experienced.  He reports that when he has the injections that his sleep will improve and as the pain returns his sleep will become more problematic.  Current Status:  Patient describes chronic pain situations that have been worsening over the past several years.  Patient has had multiple neurosurgical interventions for his prior surgeries on his neck and one prior discectomy back.  Patient denies any significant depression or other psychiatric issues denies any other significant psychosocial stressors beyond the not being able to work.  Reliability of Information: The information is derived from a review of patient's available medical records as well as 1 hour face-to-face clinical  interview with the patient.  Behavioral Observation: [name]  presents as a 52 y.o.-year-old Left Caucasian Male who appeared his stated age. his dress was Appropriate and he was Well Groomed and his manners were Appropriate to the situation.  his participation was indicative of Appropriate and Attentive behaviors.  There were  physical disabilities noted .  he displayed an appropriate level of cooperation and motivation.      Interactions:    Active Appropriate and Attentive  Attention:   within normal limits and attention span and concentration were age appropriate  Memory:   within normal limits; recent and remote memory intact  Visuo-spatial:  within normal limits  Speech (Volume):  normal  Speech:   normal;   Thought Process:  Coherent and Relevant  Though Content:  WNL; not suicidal and not homicidal  Orientation:   person, place, time/date and situation  Judgment:   Good  Planning:   Good  Affect:    Appropriate  Mood:    NA  Insight:   Good  Intelligence:   normal  Marital Status/Living: The patient was born in [place].  Both of his parents are alive and are retired.  The patient is married in 1986 and has been married for 33 years.  They have 2 adult children age 78 and 15 the patient currently lives with his wife and 2 adult children.  Current Employment: The patient has been working in the [employer] for many years retired/hobbies and interests include fishing, antiques, and yard work.  Past Employment:  The patient has worked as a Oncologist retired.  Substance Use:  No concerns of substance abuse are reported.    Education:   College  Medical History:  History reviewed. No pertinent past medical history.          Abuse/Trauma History: No reports of abuse or trauma history.  Psychiatric History:  Patient has no prior psychiatric history denies any current issues with depression, anxiety or other psychiatric issues.  He describes his personality style is easy-going his mood/agitation is only present during significant pain episodes.  Family Med/Psych History: History reviewed. No pertinent family history.  Risk of Suicide/Violence: virtually non-existent the patient denies any suicidal or homicidal ideation.  Impression/DX:  Bryan Mccann was referred for psychological evaluation as part of standard protocols for work-up for possible spinal cord  stimulator implantation.  The patient has had prior back surgeries one by Dr. Yetta Barre as well as for cervical neck surgeries.  Patient continues to have significant pain in his legs, lower back and weakness in his arms and hands.  The patient reports that the pain is been progressively worsening over the past couple of years.  The patient is receiving injections in his back are helpful but they do not sustain the patient reports that Dr. Yetta Barre did a discectomy last year the patient is gone out of work on disability.  The patient continues to have pain and weakness which is helped by the injections.  The patient reports that he has talked with the neurosurgeon who feels like another surgery may not be the proper way to go and has recommended consideration for spinal cord stimulator.  The patient has been informed about potential benefits and risk of spinal cord stimulator devices.  Patient describes chronic pain situations that have been worsening over the past several years.  Patient has had multiple neurosurgical interventions for his prior surgeries on his neck and one prior discectomy back.  Patient  denies any significant depression or other psychiatric issues denies any other significant psychosocial stressors beyond the not being able to work.   Disposition/Plan:  The patient will complete the [place] multiphasic personality inventory-2 as well as the pain patient profile.  Once these objective measures have been completed and scored a formal report will be produced and provided to Dr. Ollen BowlHarkins.  Diagnosis:    Chronic pain syndrome         Electronically Signed   _______________________ Arley PhenixJohn Ambria Mayfield, Psy.D.

## 2017-10-29 ENCOUNTER — Encounter: Payer: BLUE CROSS/BLUE SHIELD | Admitting: Psychology

## 2017-10-30 ENCOUNTER — Encounter: Payer: Self-pay | Admitting: Psychology

## 2017-10-30 ENCOUNTER — Encounter: Payer: BLUE CROSS/BLUE SHIELD | Attending: Psychology | Admitting: Psychology

## 2017-10-30 DIAGNOSIS — G894 Chronic pain syndrome: Secondary | ICD-10-CM

## 2017-10-30 NOTE — Progress Notes (Signed)
Patient:  Bryan Mccann   DOB: 31-Jul-1965  MR Number: [id number]  Location: [facility] FOR PAIN AND REHABILITATIVE MEDICINE [facility] PHYSICAL MEDICINE AND REHABILITATION [address], [place] [address] [id number] [place] [address] Dept: [phone]  Start: 9 AM End: 10 AM  Provider/Observer:     Hershal Coria PSYD  Chief Complaint:      Chief Complaint  Patient presents with  . Pain    Reason For Service:     Mihail Prettyman was referred for psychological evaluation as part of standard protocols for work-up for possible spinal cord stimulator implantation.  The patient has had prior back surgeries one by Dr. Yetta Barre as well as for cervical neck surgeries.  Patient continues to have significant pain in his legs, lower back and weakness in his arms and hands.  The patient reports that the pain is been progressively worsening over the past couple of years.  The patient is receiving injections in his back are helpful but they do not sustain the patient reports that Dr. Yetta Barre did a discectomy last year the patient is gone out of work on disability.  The patient continues to have pain and weakness which is helped by the injections.  The patient reports that he has talked with the neurosurgeon who feels like another surgery may not be the proper way to go and has recommended consideration for spinal cord stimulator.  The patient has been informed about potential benefits and risk of spinal cord stimulator devices.  Patient describes chronic pain situations that have been worsening over the past several years.  Patient has had multiple neurosurgical interventions for his prior surgeries on his neck and one prior discectomy back.  Patient denies any significant depression or other psychiatric issues denies any other significant psychosocial stressors beyond the not being able to work.   Testing Administered:  The patient completed the [place] multiphasic personality  inventory-2 as well as the pain patient profile (P3)  Participation Level:   Active  Participation Quality:  Appropriate and Attentive      Behavioral Observation:  Well Groomed, Alert, and Appropriate.   Test Results:   Initially, the patient completed the [place] multiphasic personality inventory-2.  The resulting validity scales suggest that the patient approach the test in an honest and straightforward manner neither attempting to exaggerate or minimize any current symptomatology.  This does appear to be a valid administration and completion.  The resulting basic/clinical scales showed only elevations with regard to both vague and specific physical/somatic complaints.  They were primarily very specific complaints and difficulties associated with pain.  There is no elevations on clinical scales related to depression, excessive anger/frustration, anxiety, psychotic symptoms or agitation/mania.  Further analysis utilizing content scales show the only elevated content scale have to do with health concerns.  There are no indications of anxiety or depression, obsessive thinking, family discord or other disruptive symptomatology.  Supplementary scales had only one elevated clinical item related to social avoidance.  The patient appears to have mastery/control over his thoughts and feelings.  There was no elevation in the specific scale shown to be sensitive to identifying vulnerabilities towards alcohol and substance abuse.  There were no elevations in either of his PTSD scales.  The patient did have a significant elevation with regard to somatic complaints but the patient denied any cognitive difficulties and there were no indications of subjective depression or feelings of helplessness and hopelessness or indications of ruminations.  The patient also completed the  pain patient profile (P3).  This profile has specific norms for chronic pain patients without psychiatric issues or conditions were chronic  pain is the primary/focal difficulty.  There is also a normative sample of a community-based sample with neither psychiatric or chronic pain symptoms.  The patient produced a valid profile.  The patient did have an elevation on the somatic clinical scale relative to a community based on pain sample but significantly below a chronic pain patient sample.  There were no indications of some matization or somatoform disorder.  The patient showed no elevation in either depression or anxiety either related to a community-based non-pain/nonpsychiatric sample or in the chronic pain patient sample.  Summary of Results:   The results of the objective psychological measures including the MMPI-2 as well as the pain patient profile were not indicative of any significant psychological/psychiatric issues or difficulties.  There was no indication of significant anxiety or depressive symptoms and no indications of mania or psychotic symptoms.  The patient did not appear to have any significant psychosocial stressors or family discord on these profiles.  There also no indications of any cognitive difficulties related to memory or other information processing/executive functioning difficulties.  The patient's responses were consistent with someone who has good control over his thoughts and feelings.  Impression/Diagnosis:   The results of the current psychological evaluation utilizing objective psychological measures including the MMPI-2 as well as the P3 along with review of available medical records and 1 hour face-to-face clinical interview do suggest that the patient would be an excellent candidate for spinal cord stimulator trialing and possible implantation.  There were no indications of significant difficulties related to major depressive disorder or underlying anxiety disorders.  There were no indications either through objective testings or the patient's clinical history of bipolar disorder or other disruptive  psychiatric conditions.  The patient appears to have good mastery over his thoughts and feelings and there do not appear to be any indications of cognitive difficulties related to memory deficits or executive functioning deficits.  Diagnosis:    Axis I: Chronic pain syndrome   Arley Phenix, Psy.D. Neuropsychologist

## 2017-11-23 ENCOUNTER — Encounter

## 2017-12-10 DIAGNOSIS — G894 Chronic pain syndrome: Secondary | ICD-10-CM | POA: Diagnosis not present

## 2017-12-10 DIAGNOSIS — M961 Postlaminectomy syndrome, not elsewhere classified: Secondary | ICD-10-CM | POA: Diagnosis not present

## 2017-12-13 ENCOUNTER — Ambulatory Visit: Payer: BLUE CROSS/BLUE SHIELD | Admitting: Psychology

## 2017-12-13 ENCOUNTER — Encounter

## 2017-12-21 DIAGNOSIS — G894 Chronic pain syndrome: Secondary | ICD-10-CM | POA: Diagnosis not present

## 2017-12-21 DIAGNOSIS — M961 Postlaminectomy syndrome, not elsewhere classified: Secondary | ICD-10-CM | POA: Diagnosis not present

## 2018-01-24 DIAGNOSIS — Z85828 Personal history of other malignant neoplasm of skin: Secondary | ICD-10-CM | POA: Diagnosis not present

## 2018-01-24 DIAGNOSIS — L570 Actinic keratosis: Secondary | ICD-10-CM | POA: Diagnosis not present

## 2018-01-24 DIAGNOSIS — L281 Prurigo nodularis: Secondary | ICD-10-CM | POA: Diagnosis not present

## 2018-01-24 DIAGNOSIS — L821 Other seborrheic keratosis: Secondary | ICD-10-CM | POA: Diagnosis not present

## 2018-01-24 DIAGNOSIS — D1801 Hemangioma of skin and subcutaneous tissue: Secondary | ICD-10-CM | POA: Diagnosis not present

## 2018-02-20 DIAGNOSIS — Z9689 Presence of other specified functional implants: Secondary | ICD-10-CM | POA: Diagnosis not present

## 2018-02-20 DIAGNOSIS — G894 Chronic pain syndrome: Secondary | ICD-10-CM | POA: Diagnosis not present

## 2018-02-20 DIAGNOSIS — M961 Postlaminectomy syndrome, not elsewhere classified: Secondary | ICD-10-CM | POA: Diagnosis not present

## 2018-02-20 DIAGNOSIS — R030 Elevated blood-pressure reading, without diagnosis of hypertension: Secondary | ICD-10-CM | POA: Diagnosis not present

## 2018-03-19 DIAGNOSIS — M545 Low back pain: Secondary | ICD-10-CM | POA: Diagnosis not present

## 2019-02-20 DIAGNOSIS — D2271 Melanocytic nevi of right lower limb, including hip: Secondary | ICD-10-CM | POA: Diagnosis not present

## 2019-02-20 DIAGNOSIS — D0462 Carcinoma in situ of skin of left upper limb, including shoulder: Secondary | ICD-10-CM | POA: Diagnosis not present

## 2019-02-20 DIAGNOSIS — L570 Actinic keratosis: Secondary | ICD-10-CM | POA: Diagnosis not present

## 2019-02-20 DIAGNOSIS — L281 Prurigo nodularis: Secondary | ICD-10-CM | POA: Diagnosis not present

## 2019-02-20 DIAGNOSIS — X32XXXA Exposure to sunlight, initial encounter: Secondary | ICD-10-CM | POA: Diagnosis not present

## 2019-02-20 DIAGNOSIS — D2261 Melanocytic nevi of right upper limb, including shoulder: Secondary | ICD-10-CM | POA: Diagnosis not present

## 2019-02-20 DIAGNOSIS — D2262 Melanocytic nevi of left upper limb, including shoulder: Secondary | ICD-10-CM | POA: Diagnosis not present

## 2019-02-20 DIAGNOSIS — D2272 Melanocytic nevi of left lower limb, including hip: Secondary | ICD-10-CM | POA: Diagnosis not present

## 2019-02-20 DIAGNOSIS — D485 Neoplasm of uncertain behavior of skin: Secondary | ICD-10-CM | POA: Diagnosis not present

## 2019-02-20 DIAGNOSIS — D225 Melanocytic nevi of trunk: Secondary | ICD-10-CM | POA: Diagnosis not present

## 2019-03-17 DIAGNOSIS — C44622 Squamous cell carcinoma of skin of right upper limb, including shoulder: Secondary | ICD-10-CM | POA: Diagnosis not present

## 2019-03-19 DIAGNOSIS — Z20828 Contact with and (suspected) exposure to other viral communicable diseases: Secondary | ICD-10-CM | POA: Diagnosis not present

## 2019-04-30 ENCOUNTER — Other Ambulatory Visit: Payer: Self-pay | Admitting: Neurosurgery

## 2019-04-30 ENCOUNTER — Telehealth: Payer: Self-pay | Admitting: Nurse Practitioner

## 2019-04-30 DIAGNOSIS — M961 Postlaminectomy syndrome, not elsewhere classified: Secondary | ICD-10-CM

## 2019-04-30 NOTE — Telephone Encounter (Signed)
Phone call to patient to verify medication list and allergies for myelogram procedure. Pt aware he will not need to hold any medications for this procedure. Pre and post procedure instructions reviewed with pt. Pt verbalized understanding.  

## 2019-05-08 ENCOUNTER — Other Ambulatory Visit: Payer: Self-pay

## 2019-05-08 ENCOUNTER — Ambulatory Visit
Admission: RE | Admit: 2019-05-08 | Discharge: 2019-05-08 | Disposition: A | Discharge status: Home / Self Care | Payer: Worker's Compensation | Source: Ambulatory Visit | Attending: Neurosurgery | Admitting: Neurosurgery

## 2019-05-08 DIAGNOSIS — M961 Postlaminectomy syndrome, not elsewhere classified: Secondary | ICD-10-CM

## 2019-05-08 MED ORDER — DIAZEPAM 5 MG PO TABS
5.0000 mg | ORAL_TABLET | Freq: Once | ORAL | Status: AC
  Administered 2019-05-08: 5 mg via ORAL

## 2019-05-08 MED ORDER — IOPAMIDOL (ISOVUE-M 300) INJECTION 61%
10.0000 mL | Freq: Once | INTRAMUSCULAR | Status: AC | PRN
  Administered 2019-05-08: 10 mL via INTRATHECAL

## 2019-05-08 NOTE — Discharge Instructions (Signed)

## 2019-05-20 ENCOUNTER — Ambulatory Visit: Payer: Self-pay

## 2019-05-20 ENCOUNTER — Other Ambulatory Visit: Payer: Self-pay

## 2019-05-20 DIAGNOSIS — Z23 Encounter for immunization: Secondary | ICD-10-CM

## 2019-07-16 ENCOUNTER — Ambulatory Visit: Payer: Self-pay

## 2019-07-16 ENCOUNTER — Other Ambulatory Visit: Payer: Self-pay

## 2019-07-16 DIAGNOSIS — Z0000 Encounter for general adult medical examination without abnormal findings: Secondary | ICD-10-CM

## 2019-07-16 LAB — POCT URINALYSIS DIPSTICK
Bilirubin, UA: NEGATIVE
Blood, UA: NEGATIVE
Glucose, UA: NEGATIVE
Ketones, UA: NEGATIVE
Leukocytes, UA: NEGATIVE
Nitrite, UA: NEGATIVE
Protein, UA: NEGATIVE
Spec Grav, UA: 1.025 (ref 1.010–1.025)
Urobilinogen, UA: 0.2 E.U./dL
pH, UA: 6 (ref 5.0–8.0)

## 2019-07-16 NOTE — Progress Notes (Signed)
Scheduled to complete physical 07/23/2019 with Durward Parcel, PA-C (Interim Provider).  AMD

## 2019-07-17 LAB — CMP12+LP+TP+TSH+6AC+PSA+CBC…
ALT: 35 IU/L (ref 0–44)
AST: 25 IU/L (ref 0–40)
Albumin/Globulin Ratio: 2.3 — ABNORMAL HIGH (ref 1.2–2.2)
Albumin: 4.9 g/dL (ref 3.8–4.9)
Alkaline Phosphatase: 38 IU/L — ABNORMAL LOW (ref 39–117)
BUN/Creatinine Ratio: 12 (ref 9–20)
BUN: 14 mg/dL (ref 6–24)
Basophils Absolute: 0.1 10*3/uL (ref 0.0–0.2)
Basos: 1 %
Bilirubin Total: 0.5 mg/dL (ref 0.0–1.2)
Calcium: 9.7 mg/dL (ref 8.7–10.2)
Chloride: 104 mmol/L (ref 96–106)
Chol/HDL Ratio: 3.3 ratio (ref 0.0–5.0)
Cholesterol, Total: 218 mg/dL — ABNORMAL HIGH (ref 100–199)
Creatinine, Ser: 1.14 mg/dL (ref 0.76–1.27)
EOS (ABSOLUTE): 0 10*3/uL (ref 0.0–0.4)
Eos: 1 %
Estimated CHD Risk: <0.5 times avg. (ref 0.0–1.0)
Free Thyroxine Index: 1.1 — ABNORMAL LOW (ref 1.2–4.9)
GFR calc Af Amer: 83 mL/min/{1.73_m2} (ref >59)
GFR calc non Af Amer: 72 mL/min/{1.73_m2} (ref >59)
GGT: 83 IU/L — ABNORMAL HIGH (ref 0–65)
Globulin, Total: 2.1 g/dL (ref 1.5–4.5)
Glucose: 106 mg/dL — ABNORMAL HIGH (ref 65–99)
HDL: 67 mg/dL (ref >39)
Hematocrit: 40.2 % (ref 37.5–51.0)
Hemoglobin: 13.3 g/dL (ref 13.0–17.7)
Immature Grans (Abs): 0 10*3/uL (ref 0.0–0.1)
Immature Granulocytes: 0 %
Iron: 115 ug/dL (ref 38–169)
LDH: 150 IU/L (ref 121–224)
LDL Chol Calc (NIH): 116 mg/dL — ABNORMAL HIGH (ref 0–99)
Lymphocytes Absolute: 1.7 10*3/uL (ref 0.7–3.1)
Lymphs: 34 %
MCH: 29.6 pg (ref 26.6–33.0)
MCHC: 33.1 g/dL (ref 31.5–35.7)
MCV: 89 fL (ref 79–97)
Monocytes Absolute: 0.4 10*3/uL (ref 0.1–0.9)
Monocytes: 9 %
Neutrophils Absolute: 2.8 10*3/uL (ref 1.4–7.0)
Neutrophils: 55 %
Phosphorus: 3.2 mg/dL (ref 2.8–4.1)
Platelets: 205 10*3/uL (ref 150–450)
Potassium: 4.2 mmol/L (ref 3.5–5.2)
Prostate Specific Ag, Serum: 0.7 ng/mL (ref 0.0–4.0)
RBC: 4.5 x10E6/uL (ref 4.14–5.80)
RDW: 13.1 % (ref 11.6–15.4)
Sodium: 142 mmol/L (ref 134–144)
T3 Uptake Ratio: 27 % (ref 24–39)
T4, Total: 4.1 ug/dL — ABNORMAL LOW (ref 4.5–12.0)
TSH: 1.07 u[IU]/mL (ref 0.450–4.500)
Total Protein: 7 g/dL (ref 6.0–8.5)
Triglycerides: 202 mg/dL — ABNORMAL HIGH (ref 0–149)
Uric Acid: 9.5 mg/dL — ABNORMAL HIGH (ref 3.8–8.4)
VLDL Cholesterol Cal: 35 mg/dL (ref 5–40)
WBC: 5.1 10*3/uL (ref 3.4–10.8)

## 2019-07-23 ENCOUNTER — Other Ambulatory Visit: Payer: Self-pay

## 2019-07-23 ENCOUNTER — Ambulatory Visit: Payer: Self-pay | Admitting: Physician Assistant

## 2019-07-23 VITALS — BP 126/80 | HR 76 | Temp 98.0°F | Ht 69.0 in | Wt 175.2 lb

## 2019-07-23 DIAGNOSIS — Z91030 Bee allergy status: Secondary | ICD-10-CM

## 2019-07-23 MED ORDER — EPINEPHRINE 0.3 MG/0.3ML IJ SOAJ: 0.3000 mg | Freq: Once | INTRAMUSCULAR | Status: AC

## 2019-07-23 NOTE — Progress Notes (Signed)
   Subjective: Physical exam    Patient ID: Bryan Mccann, male    DOB: [birth year], 54 y.o.   MRN: [id number]  HPI Patient presents for physical exam voices no complaints or concerns at this time.   Review of Systems  Hyperlipidemia and GERD.    Objective:   Physical Exam HEENT unremarkable.  Neck without bruits or adenopathy.  Lungs are clear to auscultation.  Heart regular rate and rhythm.  Abdomen with negative HSM, normoactive bowel sounds, soft nontender to palpation.  Cervical spine is decreased range of motion is in all fields secondary to previous fusions.  No obvious deformity of the lumbar spine.  Patient has full equal range of motion lumbar spine.  No deformity to the upper or lower extremities.  Patient is full equal range of motion upper lower extremities.  Cranial nerves II through XII grossly intact.     Assessment & Plan: Well exam.  Discussed patient labs which shows increase of his hyperlipidemia.  Advised follow-up labs in 6 months if still showing increase will consider increasing his pravastatin.  Patient requires and was granted a refill of his EpiPen.

## 2019-07-31 ENCOUNTER — Other Ambulatory Visit: Payer: Self-pay

## 2019-07-31 DIAGNOSIS — Z91030 Bee allergy status: Secondary | ICD-10-CM

## 2019-07-31 MED ORDER — EPINEPHRINE 0.3 MG/0.3ML IJ SOAJ: 0.3000 mg | INTRAMUSCULAR | 1 refills | Status: DC | PRN

## 2019-07-31 NOTE — Telephone Encounter (Signed)
Patient called to inquire about Epi-pen refill.  States it has not been called into Pharmacy.

## 2020-01-20 ENCOUNTER — Other Ambulatory Visit: Payer: Managed Care, Other (non HMO)

## 2020-04-30 ENCOUNTER — Ambulatory Visit: Payer: Self-pay

## 2020-04-30 DIAGNOSIS — Z23 Encounter for immunization: Secondary | ICD-10-CM

## 2020-07-08 ENCOUNTER — Other Ambulatory Visit: Payer: Self-pay

## 2020-07-08 ENCOUNTER — Ambulatory Visit: Payer: Self-pay

## 2020-07-08 DIAGNOSIS — Z01818 Encounter for other preprocedural examination: Secondary | ICD-10-CM

## 2020-07-08 LAB — POCT URINALYSIS DIPSTICK
Bilirubin, UA: NEGATIVE
Blood, UA: NEGATIVE
Glucose, UA: NEGATIVE
Ketones, UA: NEGATIVE
Leukocytes, UA: NEGATIVE
Nitrite, UA: NEGATIVE
Protein, UA: NEGATIVE
Spec Grav, UA: 1.025 (ref 1.010–1.025)
Urobilinogen, UA: 0.2 E.U./dL
pH, UA: 6 (ref 5.0–8.0)

## 2020-07-08 NOTE — Progress Notes (Signed)
Pt scheduled to complete physical 07/15/20. CL,RMA

## 2020-07-09 LAB — CMP12+LP+TP+TSH+6AC+PSA+CBC…
ALT: 40 IU/L (ref 0–44)
AST: 24 IU/L (ref 0–40)
Albumin/Globulin Ratio: 1.8 (ref 1.2–2.2)
Albumin: 4.8 g/dL (ref 3.8–4.9)
Alkaline Phosphatase: 49 IU/L (ref 44–121)
BUN/Creatinine Ratio: 13 (ref 9–20)
BUN: 13 mg/dL (ref 6–24)
Basophils Absolute: 0 10*3/uL (ref 0.0–0.2)
Basos: 1 %
Bilirubin Total: 0.5 mg/dL (ref 0.0–1.2)
Calcium: 9.7 mg/dL (ref 8.7–10.2)
Chloride: 106 mmol/L (ref 96–106)
Chol/HDL Ratio: 3.6 ratio (ref 0.0–5.0)
Cholesterol, Total: 231 mg/dL — ABNORMAL HIGH (ref 100–199)
Creatinine, Ser: 1.02 mg/dL (ref 0.76–1.27)
EOS (ABSOLUTE): 0 10*3/uL (ref 0.0–0.4)
Eos: 0 %
Estimated CHD Risk: 0.6 times avg. (ref 0.0–1.0)
Free Thyroxine Index: 1.7 (ref 1.2–4.9)
GFR calc Af Amer: 95 mL/min/{1.73_m2} (ref >59)
GFR calc non Af Amer: 82 mL/min/{1.73_m2} (ref >59)
GGT: 146 IU/L — ABNORMAL HIGH (ref 0–65)
Globulin, Total: 2.6 g/dL (ref 1.5–4.5)
Glucose: 103 mg/dL — ABNORMAL HIGH (ref 65–99)
HDL: 64 mg/dL (ref >39)
Hematocrit: 40 % (ref 37.5–51.0)
Hemoglobin: 13.6 g/dL (ref 13.0–17.7)
Immature Grans (Abs): 0 10*3/uL (ref 0.0–0.1)
Immature Granulocytes: 0 %
Iron: 119 ug/dL (ref 38–169)
LDH: 167 IU/L (ref 121–224)
LDL Chol Calc (NIH): 135 mg/dL — ABNORMAL HIGH (ref 0–99)
Lymphocytes Absolute: 2.1 10*3/uL (ref 0.7–3.1)
Lymphs: 41 %
MCH: 29.4 pg (ref 26.6–33.0)
MCHC: 34 g/dL (ref 31.5–35.7)
MCV: 86 fL (ref 79–97)
Monocytes Absolute: 0.4 10*3/uL (ref 0.1–0.9)
Monocytes: 8 %
Neutrophils Absolute: 2.6 10*3/uL (ref 1.4–7.0)
Neutrophils: 50 %
Phosphorus: 3.1 mg/dL (ref 2.8–4.1)
Platelets: 213 10*3/uL (ref 150–450)
Potassium: 4.1 mmol/L (ref 3.5–5.2)
Prostate Specific Ag, Serum: 0.5 ng/mL (ref 0.0–4.0)
RBC: 4.63 x10E6/uL (ref 4.14–5.80)
RDW: 13 % (ref 11.6–15.4)
Sodium: 141 mmol/L (ref 134–144)
T3 Uptake Ratio: 32 % (ref 24–39)
T4, Total: 5.4 ug/dL (ref 4.5–12.0)
TSH: 1.36 u[IU]/mL (ref 0.450–4.500)
Total Protein: 7.4 g/dL (ref 6.0–8.5)
Triglycerides: 184 mg/dL — ABNORMAL HIGH (ref 0–149)
Uric Acid: 8 mg/dL (ref 3.8–8.4)
VLDL Cholesterol Cal: 32 mg/dL (ref 5–40)
WBC: 5.2 10*3/uL (ref 3.4–10.8)

## 2020-07-09 LAB — HGB A1C W/O EAG: Hgb A1c MFr Bld: 5.4 % (ref 4.8–5.6)

## 2020-07-15 ENCOUNTER — Other Ambulatory Visit: Payer: Self-pay

## 2020-07-15 ENCOUNTER — Encounter: Payer: Self-pay | Admitting: Adult Medicine

## 2020-07-15 ENCOUNTER — Ambulatory Visit: Payer: Self-pay | Admitting: Adult Medicine

## 2020-07-15 VITALS — BP 130/90 | HR 73 | Temp 97.7°F | Resp 14 | Ht 69.0 in | Wt 172.0 lb

## 2020-07-15 DIAGNOSIS — Z01818 Encounter for other preprocedural examination: Secondary | ICD-10-CM

## 2020-07-15 DIAGNOSIS — E7800 Pure hypercholesterolemia, unspecified: Secondary | ICD-10-CM

## 2020-07-15 MED ORDER — PRAVASTATIN SODIUM 20 MG PO TABS: 20.0000 mg | ORAL_TABLET | Freq: Every day | ORAL | 3 refills | Status: DC

## 2020-07-15 NOTE — Progress Notes (Signed)
   Subjective:    Patient ID: Bryan Mccann, male    DOB: [birth year], 55 y.o.   MRN: [id number]  HPI 55 yr annual exam Chronic 20yr hx DDD back neck pain with neurostimulator in place on multiple medication. Hx hyperlidemia  Hx epipen for allergy reaction  Reports chronic stress responsible for care of elderly mother     fatigue, missing meals, sleeps 6-7h uninterrupted Meds celecoxib (CELEBREX) 200 MG capsule EPINEPHrine 0.3 mg/0.3 mL IJ SOAJ injection esomeprazole (NEXIUM) 20 MG capsule fluoruracil (CARAC) 0.5 % cream fluticasone (FLONASE) 50 MCG/ACT nasal spray HYDROcodone-acetaminophen (NORCO) 10-325 MG tablet pravastatin (PRAVACHOL) 20 MG tablet pregabalin (LYRICA) 200 MG capsule topiramate (TOPAMAX) 25 MG tablet   Review of Systems    Cardiovascular and Mediastinum Unstable angina (HCC)   Atypical chest pain @ time of diverticulitis Unstable ankle  Hx back pain with radiculopathy Heme positive stool  Single episode History of heartburn  tx nexium Numbness of tongue  resolve Objective:   Physical Exam [place] Normotensive O2 sat 98 Skin dry scaly Heent Singac at perrla eom intact neck supple Pulm clear to A&P Abd nontender bs+ no organomegaly Extr 3/5 strength lower flexor extensors Neuro  crn intact focal finding 2/3 ankle reflex no path reflexes  Ekg NSR nl morphology, interval without ectopy     Assessment & Plan:  Deg Dis Disease under pmd care with analgesics and neurostim Allergy  Controlled  Epipen Hypercholesterol discussed lab values stable progress     Add VitD Hx Atypical chest pain resolved related to time of diverticulitis Wants rec for diet supplementation suggest use protein powder, bcomplex vitc

## 2020-07-15 NOTE — Addendum Note (Signed)
Addended by: Gardner Candle on: 07/15/2020 03:16 PM   Modules accepted: Orders

## 2021-05-03 DIAGNOSIS — T85192A Other mechanical complication of implanted electronic neurostimulator (electrode) of spinal cord, initial encounter: Secondary | ICD-10-CM | POA: Diagnosis not present

## 2021-05-09 ENCOUNTER — Other Ambulatory Visit: Payer: Self-pay | Admitting: Neurological Surgery

## 2021-05-09 DIAGNOSIS — T85192A Other mechanical complication of implanted electronic neurostimulator (electrode) of spinal cord, initial encounter: Secondary | ICD-10-CM

## 2021-05-13 ENCOUNTER — Other Ambulatory Visit: Payer: Self-pay

## 2021-05-13 ENCOUNTER — Ambulatory Visit: Payer: Self-pay

## 2021-05-13 DIAGNOSIS — Z23 Encounter for immunization: Secondary | ICD-10-CM

## 2021-05-16 ENCOUNTER — Ambulatory Visit
Admission: RE | Admit: 2021-05-16 | Discharge: 2021-05-16 | Disposition: A | Discharge status: Home / Self Care | Payer: 59 | Source: Ambulatory Visit | Attending: Neurological Surgery | Admitting: Neurological Surgery

## 2021-05-16 ENCOUNTER — Other Ambulatory Visit: Payer: Self-pay

## 2021-05-16 DIAGNOSIS — M5124 Other intervertebral disc displacement, thoracic region: Secondary | ICD-10-CM | POA: Diagnosis not present

## 2021-05-16 DIAGNOSIS — M47814 Spondylosis without myelopathy or radiculopathy, thoracic region: Secondary | ICD-10-CM | POA: Diagnosis not present

## 2021-05-16 DIAGNOSIS — T85192A Other mechanical complication of implanted electronic neurostimulator (electrode) of spinal cord, initial encounter: Secondary | ICD-10-CM

## 2021-05-16 MED ORDER — MEPERIDINE HCL 50 MG/ML IJ SOLN: 50.0000 mg | Freq: Once | INTRAMUSCULAR | Status: DC | PRN

## 2021-05-16 MED ORDER — IOPAMIDOL (ISOVUE-M 300) INJECTION 61%
10.0000 mL | Freq: Once | INTRAMUSCULAR | Status: AC | PRN
  Administered 2021-05-16: 10 mL via INTRATHECAL

## 2021-05-16 MED ORDER — ONDANSETRON HCL 4 MG/2ML IJ SOLN: 4.0000 mg | Freq: Once | INTRAMUSCULAR | Status: DC | PRN

## 2021-05-16 MED ORDER — DIAZEPAM 5 MG PO TABS
10.0000 mg | ORAL_TABLET | Freq: Once | ORAL | Status: AC
  Administered 2021-05-16: 10 mg via ORAL

## 2021-05-16 NOTE — Progress Notes (Signed)
Pt reports his spinal cord stimulator no longer works and is off at this time.

## 2021-05-16 NOTE — Discharge Instructions (Signed)

## 2021-05-18 DIAGNOSIS — T85192A Other mechanical complication of implanted electronic neurostimulator (electrode) of spinal cord, initial encounter: Secondary | ICD-10-CM | POA: Diagnosis not present

## 2021-07-18 ENCOUNTER — Other Ambulatory Visit: Payer: Self-pay

## 2021-07-18 ENCOUNTER — Ambulatory Visit: Payer: Self-pay

## 2021-07-18 DIAGNOSIS — Z0000 Encounter for general adult medical examination without abnormal findings: Secondary | ICD-10-CM

## 2021-07-18 LAB — POCT URINALYSIS DIPSTICK
Bilirubin, UA: NEGATIVE
Blood, UA: NEGATIVE
Glucose, UA: NEGATIVE
Ketones, UA: NEGATIVE
Leukocytes, UA: NEGATIVE
Nitrite, UA: NEGATIVE
Protein, UA: NEGATIVE
Spec Grav, UA: 1.025 (ref 1.010–1.025)
Urobilinogen, UA: 0.2 E.U./dL
pH, UA: 6 (ref 5.0–8.0)

## 2021-07-18 NOTE — Progress Notes (Signed)
07/25/21 annual physical scheduled.

## 2021-07-19 LAB — CMP12+LP+TP+TSH+6AC+PSA+CBC…
ALT: 35 IU/L (ref 0–44)
AST: 37 IU/L (ref 0–40)
Albumin/Globulin Ratio: 1.4 (ref 1.2–2.2)
Albumin: 4.1 g/dL (ref 3.8–4.9)
Alkaline Phosphatase: 84 IU/L (ref 44–121)
BUN/Creatinine Ratio: 12 (ref 9–20)
BUN: 15 mg/dL (ref 6–24)
Basophils Absolute: 0.1 10*3/uL (ref 0.0–0.2)
Basos: 1 %
Bilirubin Total: 0.4 mg/dL (ref 0.0–1.2)
Calcium: 9 mg/dL (ref 8.7–10.2)
Chloride: 104 mmol/L (ref 96–106)
Chol/HDL Ratio: 4.2 ratio (ref 0.0–5.0)
Cholesterol, Total: 171 mg/dL (ref 100–199)
Creatinine, Ser: 1.25 mg/dL (ref 0.76–1.27)
EOS (ABSOLUTE): 0 10*3/uL (ref 0.0–0.4)
Eos: 1 %
Estimated CHD Risk: 0.8 times avg. (ref 0.0–1.0)
Free Thyroxine Index: 1.7 (ref 1.2–4.9)
GGT: 24 IU/L (ref 0–65)
Globulin, Total: 2.9 g/dL (ref 1.5–4.5)
Glucose: 88 mg/dL (ref 70–99)
HDL: 41 mg/dL (ref >39)
Hematocrit: 39.2 % (ref 37.5–51.0)
Hemoglobin: 13.4 g/dL (ref 13.0–17.7)
Immature Grans (Abs): 0 10*3/uL (ref 0.0–0.1)
Immature Granulocytes: 0 %
Iron: 91 ug/dL (ref 38–169)
LDH: 86 IU/L — ABNORMAL LOW (ref 121–224)
LDL Chol Calc (NIH): 102 mg/dL — ABNORMAL HIGH (ref 0–99)
Lymphocytes Absolute: 1.8 10*3/uL (ref 0.7–3.1)
Lymphs: 33 %
MCH: 30.6 pg (ref 26.6–33.0)
MCHC: 34.2 g/dL (ref 31.5–35.7)
MCV: 90 fL (ref 79–97)
Monocytes Absolute: 0.4 10*3/uL (ref 0.1–0.9)
Monocytes: 7 %
Neutrophils Absolute: 3.1 10*3/uL (ref 1.4–7.0)
Neutrophils: 58 %
Phosphorus: 3.5 mg/dL (ref 2.8–4.1)
Platelets: 187 10*3/uL (ref 150–450)
Potassium: 4.4 mmol/L (ref 3.5–5.2)
Prostate Specific Ag, Serum: 0.6 ng/mL (ref 0.0–4.0)
RBC: 4.38 x10E6/uL (ref 4.14–5.80)
RDW: 12.2 % (ref 11.6–15.4)
Sodium: 142 mmol/L (ref 134–144)
T3 Uptake Ratio: 22 % — ABNORMAL LOW (ref 24–39)
T4, Total: 7.7 ug/dL (ref 4.5–12.0)
TSH: 1.18 u[IU]/mL (ref 0.450–4.500)
Total Protein: 7 g/dL (ref 6.0–8.5)
Triglycerides: 160 mg/dL — ABNORMAL HIGH (ref 0–149)
Uric Acid: 6.5 mg/dL (ref 3.8–8.4)
VLDL Cholesterol Cal: 28 mg/dL (ref 5–40)
WBC: 5.4 10*3/uL (ref 3.4–10.8)
eGFR: 67 mL/min/{1.73_m2} (ref >59)

## 2021-07-25 ENCOUNTER — Ambulatory Visit: Payer: Self-pay | Admitting: Physician Assistant

## 2021-07-25 ENCOUNTER — Other Ambulatory Visit: Payer: Self-pay

## 2021-07-25 ENCOUNTER — Encounter: Payer: Self-pay | Admitting: Physician Assistant

## 2021-07-25 VITALS — BP 167/101 | HR 82 | Temp 97.6°F | Resp 14 | Ht 69.0 in | Wt 180.0 lb

## 2021-07-25 DIAGNOSIS — Z91030 Bee allergy status: Secondary | ICD-10-CM

## 2021-07-25 DIAGNOSIS — L989 Disorder of the skin and subcutaneous tissue, unspecified: Secondary | ICD-10-CM

## 2021-07-25 MED ORDER — ESOMEPRAZOLE MAGNESIUM 20 MG PO CPDR: 20.0000 mg | DELAYED_RELEASE_CAPSULE | Freq: Every day | ORAL | 1 refills | Status: DC

## 2021-07-25 MED ORDER — EPINEPHRINE 0.3 MG/0.3ML IJ SOAJ: 0.3000 mg | INTRAMUSCULAR | 1 refills | Status: DC | PRN

## 2021-07-25 MED ORDER — PRAVASTATIN SODIUM 40 MG PO TABS: 40.0000 mg | ORAL_TABLET | Freq: Every day | ORAL | 2 refills | Status: DC

## 2021-07-25 NOTE — Progress Notes (Signed)
Pt denies any concern or any issues at this time./CL,RMA

## 2021-07-25 NOTE — Progress Notes (Signed)
[place]  ____________________________________________   None    (approximate)  I have reviewed the triage vital signs and the nursing notes.   HISTORY  Chief Complaint Annual Exam   HPI Bryan Mccann is a 56 y.o. male patient presents for annual physical exam.  Patient admits to noncompliance of medication for hyperlipidemia.  Patient also requests refill n for Nexium and EpiPen.  Patient also requests consult to dermatology secondary to history of skin cancer.      Past Medical History:  Diagnosis Date   Allergy    GERD (gastroesophageal reflux disease)     Patient Active Problem List   Diagnosis Date Noted   Numbness of tongue 10/01/2017   Unstable ankle 11/24/2016   Unstable angina ([place]) 11/24/2016   Heme positive stool 07/06/2014   History of heartburn 07/06/2014    Past Surgical History:  Procedure Laterality Date   CERVICAL DISCECTOMY     CERVICAL FUSION     HERNIA REPAIR      Prior to Admission medications   Medication Sig Start Date End Date Taking? Authorizing Provider  celecoxib (CELEBREX) 200 MG capsule Take 200 mg by mouth daily. 06/17/20  Yes [provider]  EPINEPHrine 0.3 mg/0.3 mL IJ SOAJ injection Inject 0.3 mLs (0.3 mg total) into the muscle as needed for anaphylaxis. 07/31/19  Yes Sable Feil, PA-C  esomeprazole (NEXIUM) 20 MG capsule Take 20 mg by mouth daily. 07/06/14  Yes [provider]  fluticasone (FLONASE) 50 MCG/ACT nasal spray once daily. 06/15/14  Yes [provider]  HYDROcodone-acetaminophen (NORCO) 10-325 MG tablet Take 1 tablet by mouth every 6 (six) hours as needed. 10/30/16  Yes [provider]  pravastatin (PRAVACHOL) 20 MG tablet Take 1 tablet (20 mg total) by mouth daily. 07/15/20  Yes Cecil Cobbs, MD  pregabalin (LYRICA) 200 MG capsule Take 200 mg by mouth in the morning, at noon, and at bedtime. 06/17/20  Yes [provider]  topiramate (TOPAMAX) 50 MG  tablet Take 50 mg by mouth 2 (two) times daily. 06/13/21   [provider]    Allergies Patient has no known allergies.  Family History  Problem Relation Age of Onset   Heart disease Mother    Hypertension Mother    Skin cancer Mother    Diabetes Father    Skin cancer Maternal Grandmother     Social History Social History   Tobacco Use   Smoking status: Former   Smokeless tobacco: Never    Review of Systems Constitutional: No fever/chills Eyes: No visual changes. ENT: No sore throat. Cardiovascular: Denies chest pain. Respiratory: Denies shortness of breath. Gastrointestinal: No abdominal pain.  No nausea, no vomiting.  No diarrhea.  No constipation.  Gastric reflux Genitourinary: Negative for dysuria. Musculoskeletal: Negative for back pain. Skin: Negative for rash.  History of skin cancer Neurological: Negative for headaches, focal weakness or numbness. Endocrine: Hyperlipidemia Allergic/Immunilogical: Bee sting ____________________________________________   PHYSICAL EXAM:  VITAL SIGNS: Temperature 97.6, pulse 82, respiration 14, BP is 167/101, and patient is 99% O2 sat on room air. Constitutional: Alert and oriented. Well appearing and in no acute distress. Eyes: Conjunctivae are normal. PERRL. EOMI. Head: Atraumatic. Nose: No congestion/rhinnorhea. Mouth/Throat: Mucous membranes are moist.  Oropharynx non-erythematous. Neck: No stridor.  Hematological/Lymphatic/Immunilogical: No cervical lymphadenopathy. Cardiovascular: Normal rate, regular rhythm. Grossly normal heart sounds.  Good peripheral circulation.  Elevated blood pressure Respiratory: Normal respiratory effort.  No retractions. Lungs CTAB. Gastrointestinal: Soft and nontender. No distention.  No abdominal bruits. No CVA tenderness. Genitourinary: Deferred Musculoskeletal: No lower extremity tenderness nor edema.  No joint effusions. Neurologic:  Normal speech and language. No gross focal  neurologic deficits are appreciated. No gait instability. Skin:  Skin is warm, dry and intact. No rash noted.  Skin lesions left upper extremities involving the hand and forearm. Psychiatric: Mood and affect are normal. Speech and behavior are normal.  ____________________________________________   LABS            Component Ref Range & Units 7 d ago (07/18/21) 1 yr ago (07/08/20) 2 yr ago (07/16/19) 4 yr ago (11/25/16) 4 yr ago (11/25/16) 4 yr ago (11/24/16) 4 yr ago (11/24/16) 4 yr ago (11/24/16)  Glucose 70 - 99 mg/dL 88  103 High  R  106 High  R     104 High  R    Uric Acid 3.8 - 8.4 mg/dL 6.5  8.0 CM  9.5 High  CM        Comment:            Therapeutic target for gout patients: <6.0  BUN 6 - 24 mg/dL _0 R    Creatinine, Ser 0.76 - 1.27 mg/dL 1.25  1.02  1.14     0.96 R    eGFR >59 mL/min/1.73 67          BUN/Creatinine Ratio 9 - _1 Sodium 134 - 144 mmol/L 142  141  142     138 R    Potassium 3.5 - 5.2 mmol/L 4.4  4.1  4.2     3.7 R    Chloride 96 - 106 mmol/L 104  106  104     103 R    Calcium 8.7 - 10.2 mg/dL 9.0  9.7  9.7     9.8 R    Phosphorus 2.8 - 4.1 mg/dL 3.5  3.1  3.2        Total Protein 6.0 - 8.5 g/dL 7.0  7.4  7.0    7.9 R     Albumin 3.8 - 4.9 g/dL 4.1  4.8  4.9    4.8 R     Globulin, Total 1.5 - 4.5 g/dL 2.9  2.6  2.1        Albumin/Globulin Ratio 1.2 - 2.2 1.4  1.8  2.3 High         Bilirubin Total 0.0 - 1.2 mg/dL 0.4  0.5  0.5    1.1 R     Alkaline Phosphatase 44 - 121 IU/L 84  49 CM  38 Low  R    38 R     LDH 121 - 224 IU/L 86 Low   167  150        AST 0 - 40 IU/L 37  _2 R     ALT 0 - 44 IU/L 35  40  35    37 R     GGT 0 - 65 IU/L 24  146 High   83 High         Iron 38 - 169 ug/dL 91  119  115        Cholesterol, Total 100 - 199 mg/dL 171  231 High   218 High         Triglycerides 0 - 149 mg/dL 160 High   184  High   202 High   341 High  R  220 High  R      HDL >39 mg/dL 41  64  67  66 R  73 R      VLDL Cholesterol Cal 5 -  40 mg/dL 28  32  35        LDL Chol Calc (NIH) 0 - 99 mg/dL 102 High   135 High   116 High         Chol/HDL Ratio 0.0 - 5.0 ratio 4.2  3.6 CM  3.3 CM  3.6 R  3.2 R      Comment:                                   T. Chol/HDL Ratio                                              Men  Women                                1/2 Avg.Risk  3.4    3.3                                    Avg.Risk  5.0    4.4                                 2X Avg.Risk  9.6    7.1                                 3X Avg.Risk 23.4   11.0   Estimated CHD Risk 0.0 - 1.0 times avg. 0.8  0.6 CM   < 0.5 CM        Comment: The CHD Risk is based on the T. Chol/HDL ratio. Other  factors affect CHD Risk such as hypertension, smoking,  diabetes, severe obesity, and family history of  premature CHD.   TSH 0.450 - 4.500 uIU/mL 1.180  1.360  1.070        T4, Total 4.5 - 12.0 ug/dL 7.7  5.4  4.1 Low         T3 Uptake Ratio 24 - 39 % 22 Low   32  27        Free Thyroxine Index 1.2 - 4.9 1.7  1.7  1.1 Low         Prostate Specific Ag, Serum 0.0 - 4.0 ng/mL 0.6  0.5 CM  0.7 CM        Comment: Roche ECLIA methodology.  According to the American Urological Association, Serum PSA should  decrease and remain at undetectable levels after radical  prostatectomy. The AUA defines biochemical recurrence as an initial  PSA value 0.2 ng/mL or greater followed by a subsequent confirmatory  PSA value 0.2 ng/mL or greater.  Values obtained with different assay methods or kits cannot be used  interchangeably. Results cannot be interpreted as absolute evidence  of the presence or absence of malignant disease.   WBC  3.4 - 10.8 x10E3/uL 5.4  5.2  5.1      6.7 R   RBC 4.14 - 5.80 x10E6/uL 4.38  4.63  4.50      4.77 R   Hemoglobin 13.0 - 17.7 g/dL 13.4  13.6  13.3      13.7 R   Hematocrit 37.5 - 51.0 % 39.2  40.0  40.2      40.1 R   MCV 79 - 97 fL 90  86  89      84.2 R   MCH 26.6 - 33.0 pg 30.6  29.4  29.6      28.8 R   MCHC 31.5 - 35.7 g/dL 34.2   34.0  33.1      34.2 R   RDW 11.6 - 15.4 % 12.2  13.0  13.1      14.0 R   Platelets 150 - 450 x10E3/uL 187  213  205      194 R   Neutrophils Not Estab. % 58  50  55        Lymphs Not Estab. % 33  41  34        Monocytes Not Estab. % _0 Eos Not Estab. % 1  0  1        Basos Not Estab. % _1 Neutrophils Absolute 1.4 - 7.0 x10E3/uL 3.1  2.6  2.8        Lymphocytes Absolute 0.7 - 3.1 x10E3/uL 1.8  2.1  1.7        Monocytes Absolute 0.1 - 0.9 x10E3/uL 0.4  0.4  0.4        EOS (ABSOLUTE) 0.0 - 0.4 x10E3/uL 0.0  0.0  0.0        Basophils Absolute 0.0 - 0.2 x10E3/uL 0.1  0.0  0.1        Immature Granulocytes Not Estab. % 0  0  0        Immature Grans               ____________________________________________  EKG Sinus  Rhythm  WITHIN NORMAL LIMITS at 64 bpm.  ____________________________________________    ____________________________________________     ____________________________________________   INITIAL IMPRESSION / ASSESSMENT AND PLAN / ED COURSE  As part of my medical decision making, I reviewed the following data within the electronic MEDICAL RECORD NUMBER   Well exam.  Discussed lab results with patient and his noncompliance to medication for hyperlipidemia.  Patient will get a prescription pravastatin, Pre-Pen and Nexium.  Advised compliance with medications.  Consult to [facility] dermatology will be generated.  Follow-up as necessary.           _2 @   ____________________________________________   FINAL CLINICAL IMPRESSION(S) / ED DIAGNOSES  _3 @   ED Discharge Orders     None        Note:  This document was prepared using Dragon voice recognition software and may include unintentional dictation errors.

## 2021-07-25 NOTE — Addendum Note (Signed)
Addended by: Gardner Candle on: 07/25/2021 10:03 AM   Modules accepted: Orders

## 2021-08-02 DIAGNOSIS — Z85828 Personal history of other malignant neoplasm of skin: Secondary | ICD-10-CM | POA: Diagnosis not present

## 2021-08-02 DIAGNOSIS — D1801 Hemangioma of skin and subcutaneous tissue: Secondary | ICD-10-CM | POA: Diagnosis not present

## 2021-08-02 DIAGNOSIS — L812 Freckles: Secondary | ICD-10-CM | POA: Diagnosis not present

## 2021-08-02 DIAGNOSIS — L570 Actinic keratosis: Secondary | ICD-10-CM | POA: Diagnosis not present

## 2021-08-02 DIAGNOSIS — L821 Other seborrheic keratosis: Secondary | ICD-10-CM | POA: Diagnosis not present

## 2021-09-05 DIAGNOSIS — M25475 Effusion, left foot: Secondary | ICD-10-CM | POA: Diagnosis not present

## 2021-11-01 ENCOUNTER — Other Ambulatory Visit: Payer: Self-pay | Admitting: Physician Assistant

## 2021-11-01 ENCOUNTER — Ambulatory Visit: Payer: Self-pay | Admitting: Physician Assistant

## 2021-11-01 ENCOUNTER — Encounter: Payer: Self-pay | Admitting: Physician Assistant

## 2021-11-01 ENCOUNTER — Ambulatory Visit
Admission: RE | Admit: 2021-11-01 | Discharge: 2021-11-01 | Disposition: A | Discharge status: Home / Self Care | Payer: 59 | Source: Ambulatory Visit | Attending: Physician Assistant | Admitting: Physician Assistant

## 2021-11-01 ENCOUNTER — Ambulatory Visit
Admission: RE | Admit: 2021-11-01 | Discharge: 2021-11-01 | Disposition: A | Discharge status: Home / Self Care | Payer: 59 | Attending: Physician Assistant | Admitting: Physician Assistant

## 2021-11-01 DIAGNOSIS — R600 Localized edema: Secondary | ICD-10-CM | POA: Insufficient documentation

## 2021-11-01 DIAGNOSIS — R609 Edema, unspecified: Secondary | ICD-10-CM

## 2021-11-01 DIAGNOSIS — M79672 Pain in left foot: Secondary | ICD-10-CM | POA: Diagnosis not present

## 2021-11-01 NOTE — Progress Notes (Signed)
? ?  Subjective: Left foot pain  ? ? Patient ID: Bryan Mccann, male    DOB: Nov 03, 1965, 56 y.o.   MRN: [id number] ? ?HPI ?Patient presents with left foot pain 6 weeks.  Patient secondary to a twisting incident.  Patient was seen at a urgent care clinic 2 weeks post injury and lab tests consisting of CBC and a uric acid level were unremarkable.  No imaging performed.  Patient continue to have pain most in the plantar aspect of the right great toe and increased edema to the lateral aspect of the foot.  Pain increased with prolonged standing and ambulation.  Denies dyspnea or shortness of breath. ? ? ?Review of Systems ?Negative except for chief complaint ?   ?Objective:  ? Physical Exam ? ?Patient ambulates with atypical gait favoring the left foot.  BP is 137/85, temperature 97.5, pulse 65, patient 99% O2 sat on room air.  Patient weighs 175 pounds. ?Examination of the foot shows no obvious deformity.  There is moderate edema.  Patient neurovascular intact with palpable posterior tibial and dorsalis pulse. ? ? ?   ?Assessment & Plan: Left foot edema  ?Differentials consist of foot fracture, gout, and peripheral edema.  Advised patient we will start off by getting x-rays of the left foot.  If negative findings will further evaluate complaint we will repeat uric acid level and ultrasound. ? ?

## 2021-11-01 NOTE — Progress Notes (Signed)
[id number] 

## 2021-11-02 NOTE — Progress Notes (Signed)
Durward Parcel, PA-C reviewed xray results with [facility]. ? ?U/S LLE ordered & scheduled for 11/08/2021 at 5:30 pm with an arrival time of 5:00 pm at [facility]. ?Loraine Leriche notified of date/time & location. ? ?AMD ?

## 2021-11-08 ENCOUNTER — Ambulatory Visit
Admission: RE | Admit: 2021-11-08 | Discharge: 2021-11-08 | Disposition: A | Discharge status: Home / Self Care | Payer: 59 | Source: Ambulatory Visit | Attending: Physician Assistant | Admitting: Physician Assistant

## 2021-11-08 DIAGNOSIS — M7122 Synovial cyst of popliteal space [Baker], left knee: Secondary | ICD-10-CM | POA: Diagnosis not present

## 2021-11-08 DIAGNOSIS — R609 Edema, unspecified: Secondary | ICD-10-CM | POA: Insufficient documentation

## 2021-11-08 DIAGNOSIS — R600 Localized edema: Secondary | ICD-10-CM | POA: Diagnosis not present

## 2021-11-09 ENCOUNTER — Telehealth: Payer: Self-pay

## 2021-11-09 DIAGNOSIS — M7122 Synovial cyst of popliteal space [Baker], left knee: Secondary | ICD-10-CM

## 2021-11-09 NOTE — Telephone Encounter (Signed)
Nona Dell, PA-C sent message to make ortho referral for Bryan Mccann. ? ?Dx: Left leg pain with [place] finding of a Baker's cyst. ? ?Referral put in Epic for Dr. Rosita Kea of  ?[facility]. ? ?AMD ?

## 2022-04-10 DIAGNOSIS — S8391XA Sprain of unspecified site of right knee, initial encounter: Secondary | ICD-10-CM | POA: Diagnosis not present

## 2022-04-11 DIAGNOSIS — S8391XA Sprain of unspecified site of right knee, initial encounter: Secondary | ICD-10-CM | POA: Diagnosis not present

## 2022-06-05 ENCOUNTER — Encounter: Payer: Self-pay | Admitting: Physician Assistant

## 2022-06-05 ENCOUNTER — Ambulatory Visit: Payer: Self-pay | Admitting: Physician Assistant

## 2022-06-05 VITALS — BP 145/92 | HR 60 | Resp 14 | Ht 69.0 in | Wt 170.0 lb

## 2022-06-05 DIAGNOSIS — R600 Localized edema: Secondary | ICD-10-CM

## 2022-06-05 MED ORDER — METHYLPREDNISOLONE 4 MG PO TBPK: ORAL_TABLET | ORAL | 0 refills | Status: DC

## 2022-06-05 NOTE — Progress Notes (Signed)
   Subjective: Right ankle pain and edema    Patient ID: Bryan Mccann, male    DOB: 05/23/1966, 56 y.o.   MRN: [id number]  HPI Patient states 2 weeks of nontraumatic pain edema to the right ankle which is worsened in the day.  Patient also states complaints associated with intermitting numbness and tingling.  States similar incident 5 to 6 months ago and was diagnosed as cellulitis and placed on antibiotics and steroids.   Review of Systems Negative except for chief complaint    Objective:   Physical Exam  BP is 145/92, pulse 60, respiration 14, patient with 170 pounds BMI is 25.10.  Examination of the right foot/ankle reveals no obvious deformity.  Mild edema to the lateral aspect of the right ankle.  Palpable dorsalis posterior tibial pulse.  Capillary refill greater than 2 seconds.  No guarding with palpation.      Assessment & Plan: Left ankle pain   Discussed differentials with patient consisting of inflammatory process versus mild fluid retention.  Patient placed in a compression ankle support and given a prescription for Medrol Dosepak.  Patient advised to measure ankle record measurements for 5 days.  Return in 1 week for reevaluation.

## 2022-06-05 NOTE — Progress Notes (Signed)
X 2weeks: Started with Swollen right foot in the middle of the night with severe pain, burning and pain in left as well more so in the right. He states he can feel the numbness and tingling running down his legs.

## 2022-06-06 IMAGING — US US EXTREM LOW VENOUS*L*
1 series · 13 of 24 positions shown · non-contrast
Comparison: None Available.

CLINICAL DATA: Left lower extremity edema for the past 6 weeks.
Evaluate for DVT.



[Series 1: us venous img lower uni left (dvt) · portal-venous · 13 of 35 slices shown]
[im 1/35]
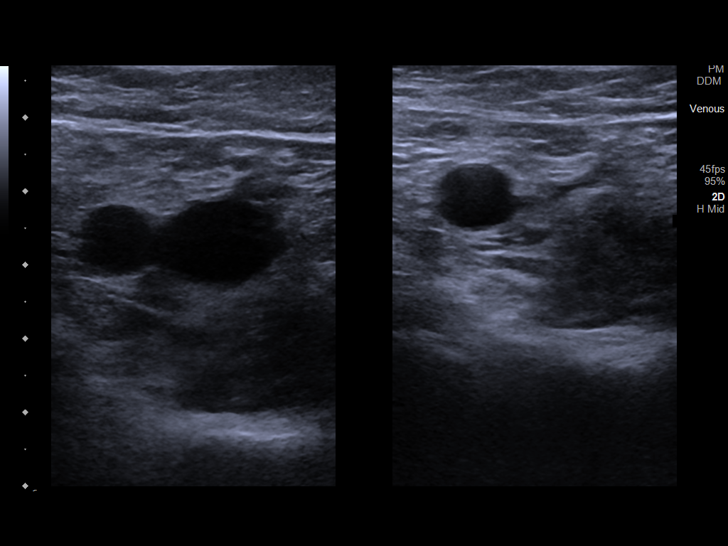
[im 3/35]
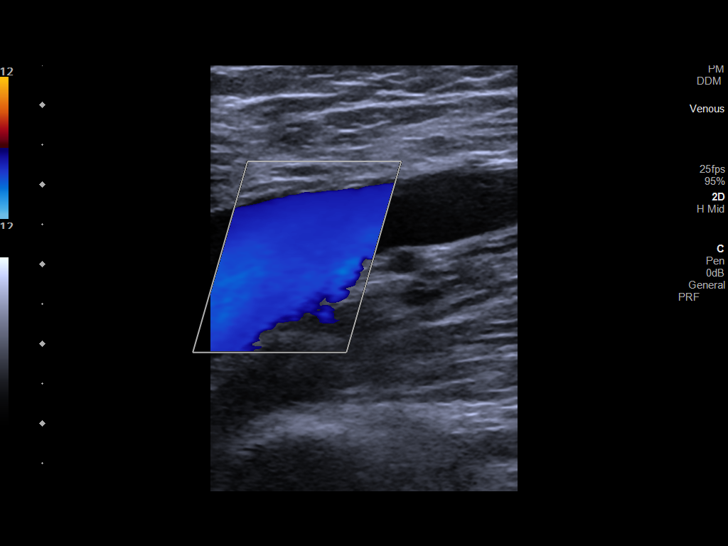
[im 6/35]
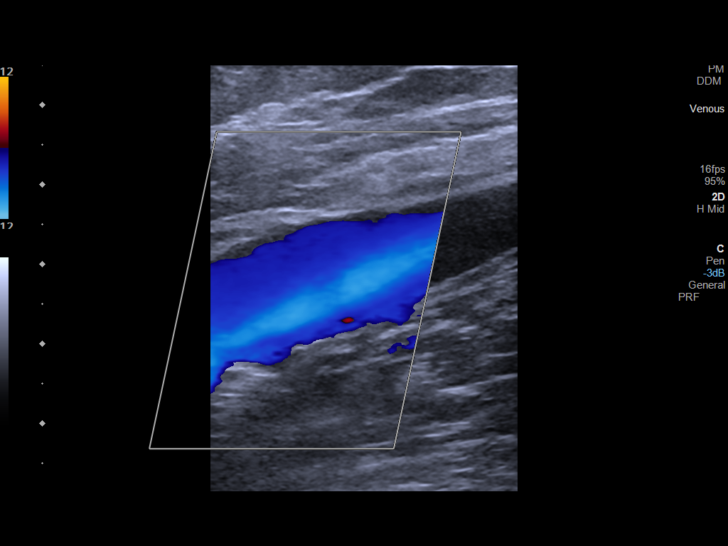
[im 9/35]
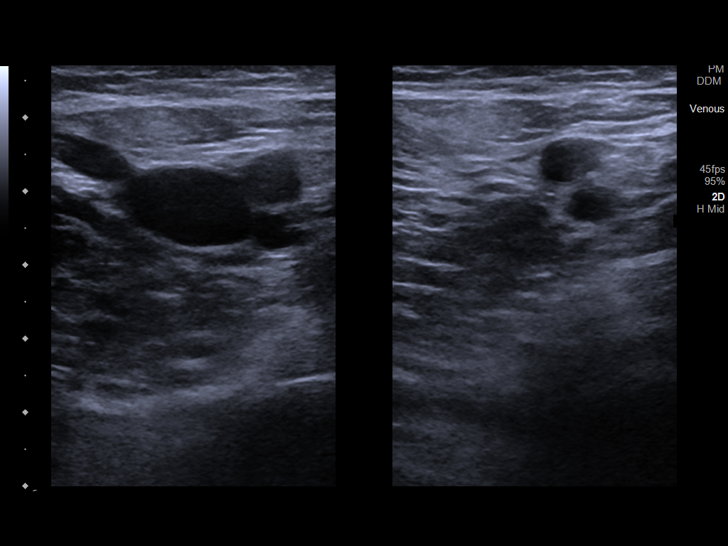
[im 12/35]
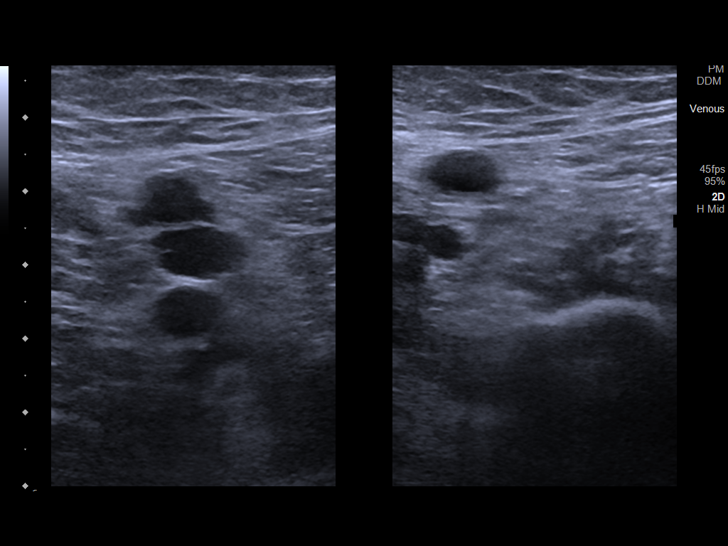
[im 15/35]
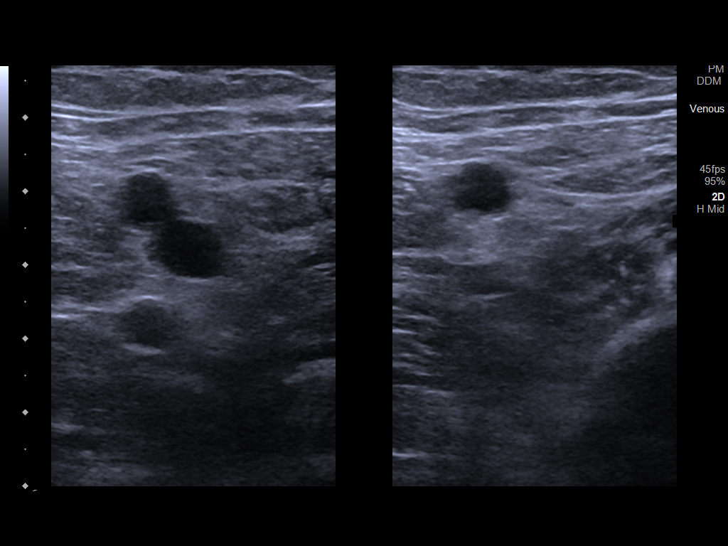
[im 18/35]
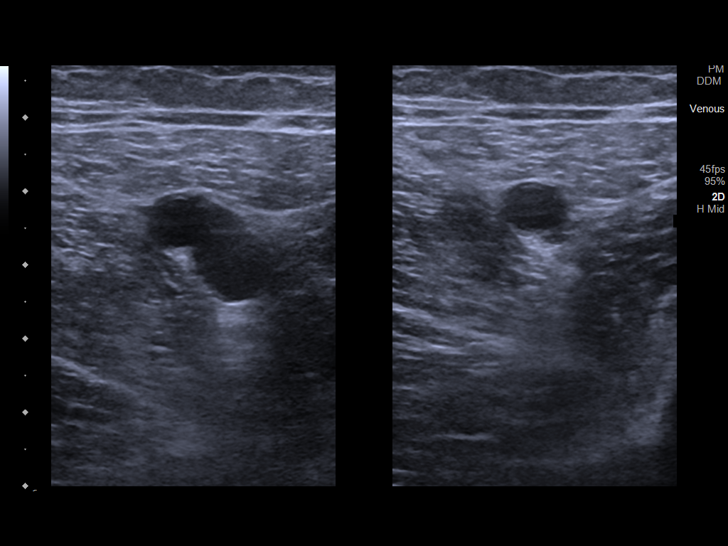
[im 20/35]
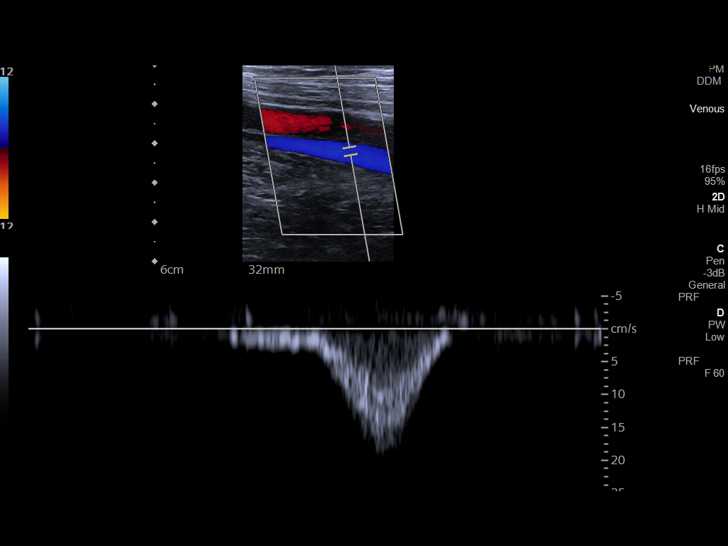
[im 23/35]
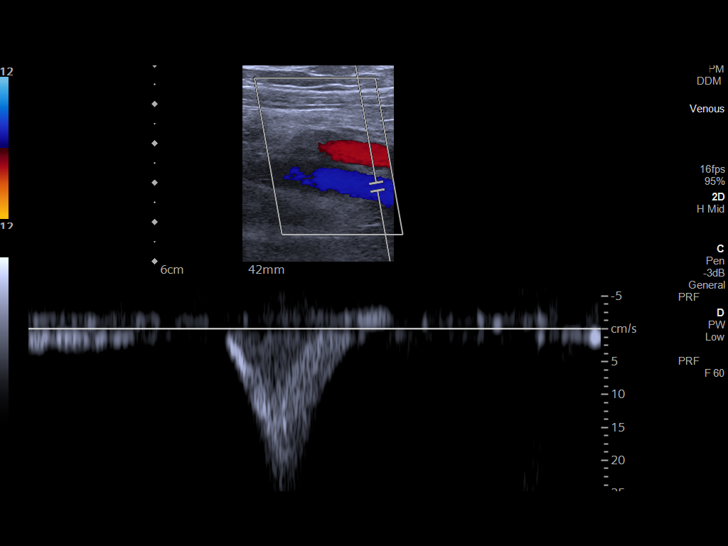
[im 26/35]
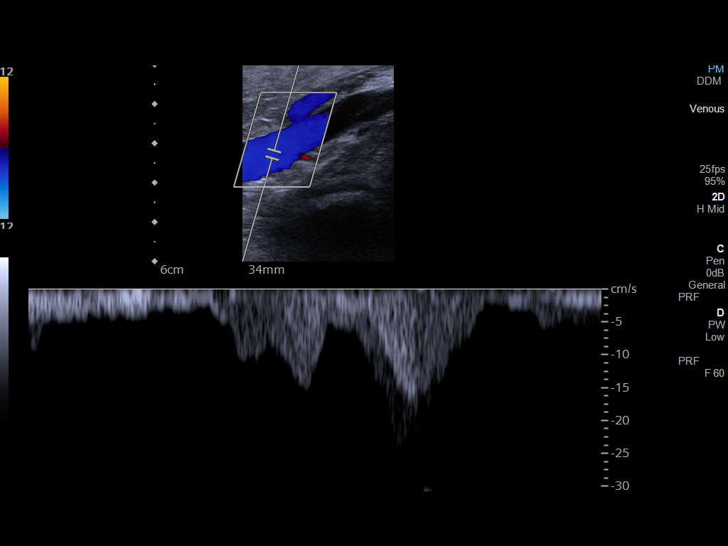
[im 29/35]
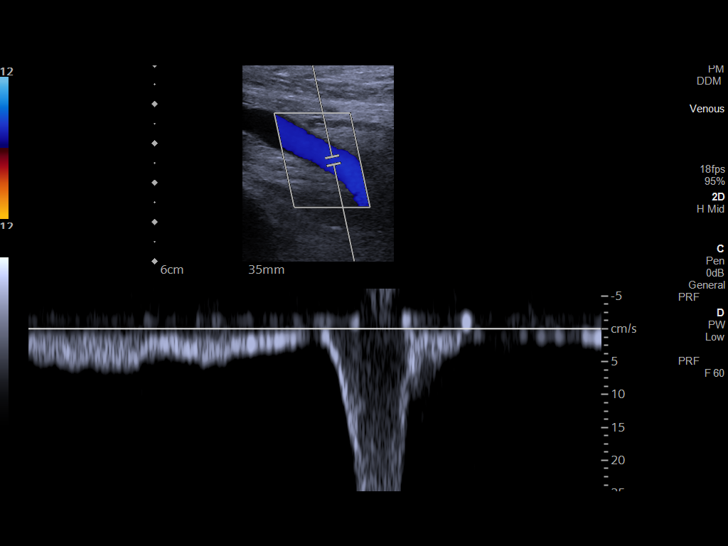
[im 32/35]
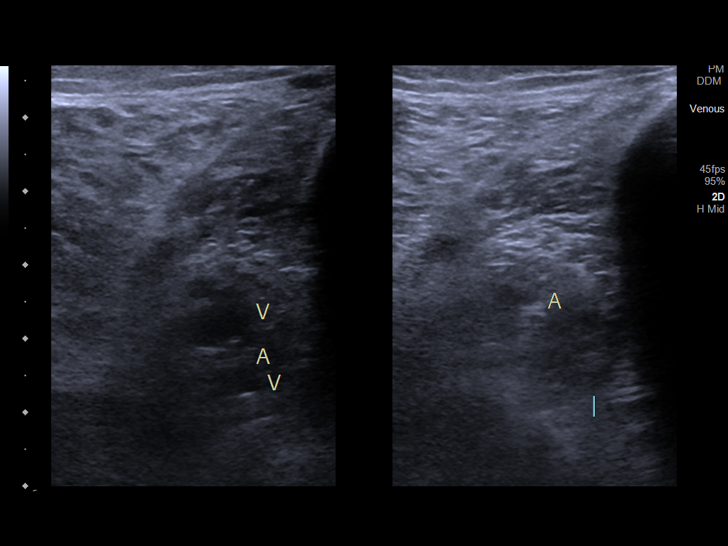
[im 35/35]
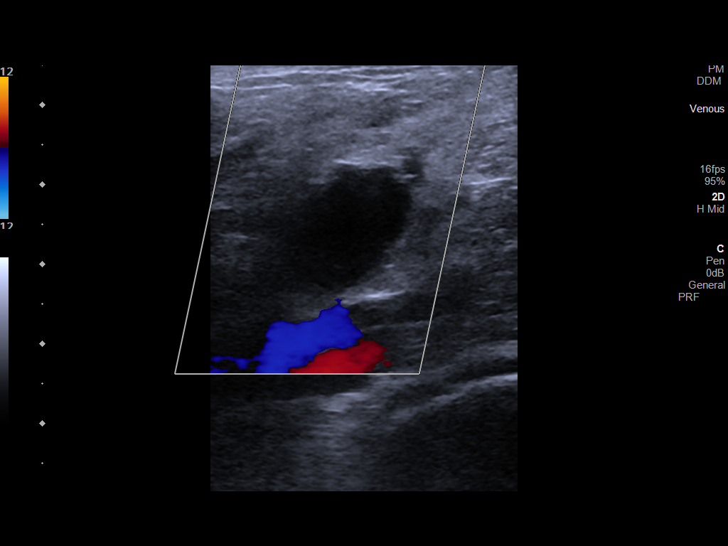

[13 of 24 positions shown; findings below may reference images not displayed]

FINDINGS: Contralateral Common Femoral Vein: Respiratory phasicity is normal
and symmetric with the symptomatic side. No evidence of thrombus.
Normal compressibility.

Common Femoral Vein: No evidence of thrombus. Normal
compressibility, respiratory phasicity and response to augmentation.

Saphenofemoral Junction: No evidence of thrombus. Normal
compressibility and flow on color Doppler imaging.

Profunda Femoral Vein: No evidence of thrombus. Normal
compressibility and flow on color Doppler imaging.

Femoral Vein: No evidence of thrombus. Normal compressibility,
respiratory phasicity and response to augmentation.

Popliteal Vein: No evidence of thrombus. Normal compressibility,
respiratory phasicity and response to augmentation.

Calf Veins: No evidence of thrombus. Normal compressibility and flow
on color Doppler imaging.

Superficial Great Saphenous Vein: No evidence of thrombus. Normal
compressibility.

Venous Reflux:  None.

Other Findings: There is an approximately 1.8 x 1.7 x 1.1 cm fluid
collection within the left popliteal fossa compatible with a Baker's
cyst (images 35 and 36)
IMPRESSION: 1. No evidence DVT within the left lower extremity.
2. Note made of an approximately 1.8 cm left-sided Baker's cyst.

## 2022-06-12 ENCOUNTER — Ambulatory Visit: Payer: Self-pay | Admitting: Physician Assistant

## 2022-06-28 ENCOUNTER — Other Ambulatory Visit: Payer: Self-pay

## 2022-06-28 DIAGNOSIS — Z87898 Personal history of other specified conditions: Secondary | ICD-10-CM

## 2022-06-28 MED ORDER — ESOMEPRAZOLE MAGNESIUM 20 MG PO CPDR: 20.0000 mg | DELAYED_RELEASE_CAPSULE | Freq: Every day | ORAL | 3 refills | Status: AC

## 2022-08-07 DIAGNOSIS — L730 Acne keloid: Secondary | ICD-10-CM | POA: Diagnosis not present

## 2022-08-07 DIAGNOSIS — L821 Other seborrheic keratosis: Secondary | ICD-10-CM | POA: Diagnosis not present

## 2022-08-07 DIAGNOSIS — D225 Melanocytic nevi of trunk: Secondary | ICD-10-CM | POA: Diagnosis not present

## 2022-08-07 DIAGNOSIS — I788 Other diseases of capillaries: Secondary | ICD-10-CM | POA: Diagnosis not present

## 2022-08-07 DIAGNOSIS — Z85828 Personal history of other malignant neoplasm of skin: Secondary | ICD-10-CM | POA: Diagnosis not present

## 2022-08-07 DIAGNOSIS — D2272 Melanocytic nevi of left lower limb, including hip: Secondary | ICD-10-CM | POA: Diagnosis not present

## 2022-08-07 DIAGNOSIS — L570 Actinic keratosis: Secondary | ICD-10-CM | POA: Diagnosis not present

## 2022-08-07 DIAGNOSIS — D1801 Hemangioma of skin and subcutaneous tissue: Secondary | ICD-10-CM | POA: Diagnosis not present

## 2022-08-07 DIAGNOSIS — L700 Acne vulgaris: Secondary | ICD-10-CM | POA: Diagnosis not present

## 2022-08-07 DIAGNOSIS — D2271 Melanocytic nevi of right lower limb, including hip: Secondary | ICD-10-CM | POA: Diagnosis not present

## 2022-08-10 DIAGNOSIS — T85192A Other mechanical complication of implanted electronic neurostimulator (electrode) of spinal cord, initial encounter: Secondary | ICD-10-CM | POA: Diagnosis not present

## 2022-08-10 DIAGNOSIS — G894 Chronic pain syndrome: Secondary | ICD-10-CM | POA: Diagnosis not present

## 2022-08-11 DIAGNOSIS — G894 Chronic pain syndrome: Secondary | ICD-10-CM | POA: Diagnosis not present

## 2022-08-11 DIAGNOSIS — T85192A Other mechanical complication of implanted electronic neurostimulator (electrode) of spinal cord, initial encounter: Secondary | ICD-10-CM | POA: Diagnosis not present

## 2022-08-31 ENCOUNTER — Ambulatory Visit: Payer: Self-pay

## 2022-08-31 DIAGNOSIS — Z0000 Encounter for general adult medical examination without abnormal findings: Secondary | ICD-10-CM

## 2022-08-31 LAB — POCT URINALYSIS DIPSTICK
Bilirubin, UA: NEGATIVE
Blood, UA: NEGATIVE
Glucose, UA: NEGATIVE
Ketones, UA: NEGATIVE
Leukocytes, UA: NEGATIVE
Nitrite, UA: NEGATIVE
Protein, UA: NEGATIVE
Spec Grav, UA: 1.025 (ref 1.010–1.025)
Urobilinogen, UA: 0.2 E.U./dL
pH, UA: 5 (ref 5.0–8.0)

## 2022-08-31 NOTE — Progress Notes (Signed)
Pt presents today to complete lab portion of physical./CL,RMA

## 2022-09-01 LAB — CMP12+LP+TP+TSH+6AC+PSA+CBC…
ALT: 32 IU/L (ref 0–44)
AST: 24 IU/L (ref 0–40)
Albumin/Globulin Ratio: 2.1 (ref 1.2–2.2)
Albumin: 4.8 g/dL (ref 3.8–4.9)
Alkaline Phosphatase: 55 IU/L (ref 44–121)
BUN/Creatinine Ratio: 12 (ref 9–20)
BUN: 14 mg/dL (ref 6–24)
Basophils Absolute: 0.1 10*3/uL (ref 0.0–0.2)
Basos: 2 %
Bilirubin Total: 0.5 mg/dL (ref 0.0–1.2)
Calcium: 9.6 mg/dL (ref 8.7–10.2)
Chloride: 106 mmol/L (ref 96–106)
Chol/HDL Ratio: 3.3 ratio (ref 0.0–5.0)
Cholesterol, Total: 205 mg/dL — ABNORMAL HIGH (ref 100–199)
Creatinine, Ser: 1.21 mg/dL (ref 0.76–1.27)
EOS (ABSOLUTE): 0.1 10*3/uL (ref 0.0–0.4)
Eos: 2 %
Estimated CHD Risk: <0.5 times avg. (ref 0.0–1.0)
Free Thyroxine Index: 1.2 (ref 1.2–4.9)
GGT: 61 IU/L (ref 0–65)
Globulin, Total: 2.3 g/dL (ref 1.5–4.5)
Glucose: 97 mg/dL (ref 70–99)
HDL: 63 mg/dL (ref >39)
Hematocrit: 39 % (ref 37.5–51.0)
Hemoglobin: 13.3 g/dL (ref 13.0–17.7)
Immature Grans (Abs): 0 10*3/uL (ref 0.0–0.1)
Immature Granulocytes: 0 %
Iron: 97 ug/dL (ref 38–169)
LDH: 160 IU/L (ref 121–224)
LDL Chol Calc (NIH): 98 mg/dL (ref 0–99)
Lymphocytes Absolute: 1.8 10*3/uL (ref 0.7–3.1)
Lymphs: 41 %
MCH: 29.6 pg (ref 26.6–33.0)
MCHC: 34.1 g/dL (ref 31.5–35.7)
MCV: 87 fL (ref 79–97)
Monocytes Absolute: 0.4 10*3/uL (ref 0.1–0.9)
Monocytes: 9 %
Neutrophils Absolute: 2.1 10*3/uL (ref 1.4–7.0)
Neutrophils: 46 %
Phosphorus: 3.5 mg/dL (ref 2.8–4.1)
Platelets: 205 10*3/uL (ref 150–450)
Potassium: 4 mmol/L (ref 3.5–5.2)
Prostate Specific Ag, Serum: 5.3 ng/mL — ABNORMAL HIGH (ref 0.0–4.0)
RBC: 4.5 x10E6/uL (ref 4.14–5.80)
RDW: 13.3 % (ref 11.6–15.4)
Sodium: 143 mmol/L (ref 134–144)
T3 Uptake Ratio: 26 % (ref 24–39)
T4, Total: 4.6 ug/dL (ref 4.5–12.0)
TSH: 1.97 u[IU]/mL (ref 0.450–4.500)
Total Protein: 7.1 g/dL (ref 6.0–8.5)
Triglycerides: 266 mg/dL — ABNORMAL HIGH (ref 0–149)
Uric Acid: 9 mg/dL — ABNORMAL HIGH (ref 3.8–8.4)
VLDL Cholesterol Cal: 44 mg/dL — ABNORMAL HIGH (ref 5–40)
WBC: 4.5 10*3/uL (ref 3.4–10.8)
eGFR: 69 mL/min/{1.73_m2} (ref >59)

## 2022-09-06 DIAGNOSIS — Z6826 Body mass index (BMI) 26.0-26.9, adult: Secondary | ICD-10-CM | POA: Diagnosis not present

## 2022-09-06 DIAGNOSIS — F1120 Opioid dependence, uncomplicated: Secondary | ICD-10-CM | POA: Diagnosis not present

## 2022-09-06 DIAGNOSIS — Z9689 Presence of other specified functional implants: Secondary | ICD-10-CM | POA: Diagnosis not present

## 2022-09-06 DIAGNOSIS — G894 Chronic pain syndrome: Secondary | ICD-10-CM | POA: Diagnosis not present

## 2022-09-07 ENCOUNTER — Ambulatory Visit: Payer: Self-pay | Admitting: Physician Assistant

## 2022-09-07 ENCOUNTER — Encounter: Payer: Self-pay | Admitting: Physician Assistant

## 2022-09-07 VITALS — BP 156/94 | HR 59 | Temp 98.0°F | Resp 12 | Ht 69.0 in | Wt 177.0 lb

## 2022-09-07 DIAGNOSIS — M5481 Occipital neuralgia: Secondary | ICD-10-CM | POA: Insufficient documentation

## 2022-09-07 DIAGNOSIS — Z91030 Bee allergy status: Secondary | ICD-10-CM

## 2022-09-07 DIAGNOSIS — T859XXA Unspecified complication of internal prosthetic device, implant and graft, initial encounter: Secondary | ICD-10-CM | POA: Insufficient documentation

## 2022-09-07 DIAGNOSIS — Z0000 Encounter for general adult medical examination without abnormal findings: Secondary | ICD-10-CM

## 2022-09-07 DIAGNOSIS — R972 Elevated prostate specific antigen [PSA]: Secondary | ICD-10-CM

## 2022-09-07 MED ORDER — EPINEPHRINE 0.3 MG/0.3ML IJ SOAJ: 0.3000 mg | INTRAMUSCULAR | 1 refills | Status: DC | PRN

## 2022-09-07 MED ORDER — ROSUVASTATIN CALCIUM 40 MG PO TABS: 40.0000 mg | ORAL_TABLET | Freq: Every day | ORAL | 3 refills | Status: DC

## 2022-09-07 NOTE — Progress Notes (Signed)
[facility]  ____________________________________________   None    (approximate)  I have reviewed the triage vital signs and the nursing notes.   HISTORY  Chief Complaint Annual Exam   HPI Bryan Mccann is a 57 y.o. male patient presents for annual physical exam.  Patient voices no concerns or complaints.  Requests refill of EpiPen.         Past Medical History:  Diagnosis Date   Allergy    GERD (gastroesophageal reflux disease)     Patient Active Problem List   Diagnosis Date Noted   Numbness of tongue 10/01/2017   Unstable ankle 11/24/2016   Unstable angina ([place]) 11/24/2016   Heme positive stool 07/06/2014   History of heartburn 07/06/2014    Past Surgical History:  Procedure Laterality Date   CERVICAL DISCECTOMY     CERVICAL FUSION     HERNIA REPAIR      Prior to Admission medications   Medication Sig Start Date End Date Taking? Authorizing Provider  celecoxib (CELEBREX) 200 MG capsule Take 200 mg by mouth daily. 06/17/20  Yes [provider]  EPINEPHrine 0.3 mg/0.3 mL IJ SOAJ injection Inject 0.3 mg into the muscle as needed for anaphylaxis. 07/25/21  Yes Sable Feil, PA-C  esomeprazole (NEXIUM) 20 MG capsule Take 1 capsule (20 mg total) by mouth daily. 06/28/22  Yes Sable Feil, PA-C  fluticasone (FLONASE) 50 MCG/ACT nasal spray once daily. 06/15/14  Yes [provider]  HYDROcodone-acetaminophen (NORCO) 10-325 MG tablet Take 1 tablet by mouth every 6 (six) hours as needed. 10/30/16  Yes [provider]  methylPREDNISolone (MEDROL DOSEPAK) 4 MG TBPK tablet Take Tapered dose as directed 06/05/22  Yes Sable Feil, PA-C  naloxone [facility]) nasal spray 4 mg/0.1 mL Place into the nose. 06/28/22  Yes [provider]  Oxycodone HCl 10 MG TABS Take 10 mg by mouth. 08/11/22  Yes [provider]  pravastatin (PRAVACHOL) 40 MG tablet Take 1 tablet (40 mg total) by mouth  daily. 07/25/21  Yes Sable Feil, PA-C  pregabalin (LYRICA) 200 MG capsule Take 200 mg by mouth in the morning, at noon, and at bedtime. 06/17/20  Yes [provider]  topiramate (TOPAMAX) 50 MG tablet Take 50 mg by mouth 2 (two) times daily. 06/13/21  Yes [provider]  triamcinolone cream (KENALOG) 0.1 % Apply 1 Application topically as needed. 08/07/22  Yes [provider]  XTAMPZA ER 9 MG C12A Take 1 capsule by mouth every 12 (twelve) hours.   Yes [provider]    Allergies Bee venom  Family History  Problem Relation Age of Onset   Heart disease Mother    Hypertension Mother    Skin cancer Mother    Diabetes Father    Skin cancer Maternal Grandmother     Social History Social History   Tobacco Use   Smoking status: Former   Smokeless tobacco: Never    Review of Systems Constitutional: No fever/chills Eyes: No visual changes. ENT: No sore throat. Cardiovascular: Denies chest pain. Respiratory: Denies shortness of breath. Gastrointestinal: No abdominal pain.  No nausea, no vomiting.  No diarrhea.  No constipation. Genitourinary: Negative for dysuria. Musculoskeletal: Negative for back pain. Skin: Negative for rash. Neurological: Negative for headaches, focal weakness or numbness. Allergic/Immunilogical: Bee venom ____________________________________________   PHYSICAL EXAM:  VITAL SIGNS:Your BP is 156/94, pulse 59, respiration 12, temperature 98, patient 97% O2 sat on room air.  Patient placed  on 77 pounds BMI is 26.14. Constitutional: Alert and oriented. Well appearing and in no acute distress. Eyes: Conjunctivae are normal. PERRL. EOMI. Head: Atraumatic. Nose: No congestion/rhinnorhea. Mouth/Throat: Mucous membranes are moist.  Oropharynx non-erythematous. Neck: No stridor.  No cervical spine tenderness to palpation. Hematological/Lymphatic/Immunilogical: No cervical lymphadenopathy. Cardiovascular: Normal rate, regular  rhythm. Grossly normal heart sounds.  Good peripheral circulation. Respiratory: Normal respiratory effort.  No retractions. Lungs CTAB. Gastrointestinal: Soft and nontender. No distention. No abdominal bruits. No CVA tenderness. Genitourinary: Deferred Musculoskeletal: No lower extremity tenderness nor edema.  No joint effusions. Neurologic:  Normal speech and language. No gross focal neurologic deficits are appreciated. No gait instability. Skin:  Skin is warm, dry and intact. No rash noted. Psychiatric: Mood and affect are normal. Speech and behavior are normal.  ____________________________________________   LABS _       Component Ref Range & Units 7 d ago 1 yr ago 2 yr ago 3 yr ago  Color, UA yellow Light Yellow Light Yellow Light Yellow  Clarity, UA clear Clear Clear Clear  Glucose, UA Negative Negative Negative Negative Negative  Bilirubin, UA neg Negative Negative Negative  Ketones, UA neg Negative Negative Negative  Spec Grav, UA 1.010 - 1.025 1.025 1.025 1.025 1.025  Blood, UA neg Negative Negative Negative  pH, UA 5.0 - 8.0 5.0 6.0 6.0 6.0  Protein, UA Negative Negative Negative Negative Negative  Urobilinogen, UA 0.2 or 1.0 E.U./dL 0.2 0.2 0.2 0.2  Nitrite, UA neg Negative Negative Negative  Leukocytes, UA Negative Negative Negative Negative Negative  Appearance dark     Odor none                                  Component Ref Range & Units 7 d ago (08/31/22) 1 yr ago (07/18/21) 2 yr ago (07/08/20) 3 yr ago (07/16/19) 5 yr ago (11/25/16) 5 yr ago (11/25/16) 5 yr ago (11/24/16) 5 yr ago (11/24/16) 5 yr ago (11/24/16)  Glucose 70 - 99 mg/dL 97 88 103 High  R 106 High  R    104 High  R   Uric Acid 3.8 - 8.4 mg/dL 9.0 High  6.5 CM 8.0 CM 9.5 High  CM       Comment:            Therapeutic target for gout patients: <6.0  BUN 6 - 24 mg/dL '14 15 13 14    14 '$ R   Creatinine, Ser 0.76 - 1.27 mg/dL 1.21 1.25 1.02 1.14    0.96 R   eGFR >59 mL/min/1.73 69 67          BUN/Creatinine Ratio 9 - '20 12 12 13 12       '$ Sodium 134 - 144 mmol/L 143 142 141 142    138 R   Potassium 3.5 - 5.2 mmol/L 4.0 4.4 4.1 4.2    3.7 R   Chloride 96 - 106 mmol/L 106 104 106 104    103 R   Calcium 8.7 - 10.2 mg/dL 9.6 9.0 9.7 9.7    9.8 R   Phosphorus 2.8 - 4.1 mg/dL 3.5 3.5 3.1 3.2       Total Protein 6.0 - 8.5 g/dL 7.1 7.0 7.4 7.0   7.9 R    Albumin 3.8 - 4.9 g/dL 4.8 4.1 4.8 4.9   4.8 R    Globulin, Total 1.5 - 4.5 g/dL 2.3 2.9 2.6 2.1  Albumin/Globulin Ratio 1.2 - 2.2 2.1 1.4 1.8 2.3 High        Bilirubin Total 0.0 - 1.2 mg/dL 0.5 0.4 0.5 0.5   1.1 R    Alkaline Phosphatase 44 - 121 IU/L 55 84 49 CM 38 Low  R   38 R    LDH 121 - 224 IU/L 160 86 Low  167 150       AST 0 - 40 IU/L 24 37 '24 25   30 '$ R    ALT 0 - 44 IU/L 32 35 40 35   37 R    GGT 0 - 65 IU/L 61 24 146 High  83 High        Iron 38 - 169 ug/dL 97 91 119 115       Cholesterol, Total 100 - 199 mg/dL 205 High  171 231 High  218 High        Triglycerides 0 - 149 mg/dL 266 High  160 High  184 High  202 High  341 High  R 220 High  R     HDL >39 mg/dL 63 41 64 67 66 R 73 R     VLDL Cholesterol Cal 5 - 40 mg/dL 44 High  28 32 35       LDL Chol Calc (NIH) 0 - 99 mg/dL 98 102 High  135 High  116 High        Chol/HDL Ratio 0.0 - 5.0 ratio 3.3 4.2 CM 3.6 CM 3.3 CM 3.6 R 3.2 R     Comment:                                   T. Chol/HDL Ratio                                             Men  Women                               1/2 Avg.Risk  3.4    3.3                                   Avg.Risk  5.0    4.4                                2X Avg.Risk  9.6    7.1                                3X Avg.Risk 23.4   11.0  Estimated CHD Risk 0.0 - 1.0 times avg.  < 0.5 0.8 CM 0.6 CM  < 0.5 CM       Comment: The CHD Risk is based on the T. Chol/HDL ratio. Other factors affect CHD Risk such as hypertension, smoking, diabetes, severe obesity, and family history of premature CHD.  TSH 0.450 - 4.500  uIU/mL 1.970 1.180 1.360 1.070       T4, Total 4.5 - 12.0 ug/dL 4.6 7.7 5.4 4.1 Low        T3 Uptake  Ratio 24 - 39 % 26 22 Low  32 27       Free Thyroxine Index 1.2 - 4.9 1.2 1.7 1.7 1.1 Low        Prostate Specific Ag, Serum 0.0 - 4.0 ng/mL 5.3 High  0.6 CM 0.5 CM 0.7 CM       Comment: Roche ECLIA methodology. According to the American Urological Association, Serum PSA should decrease and remain at undetectable levels after radical prostatectomy. The AUA defines biochemical recurrence as an initial PSA value 0.2 ng/mL or greater followed by a subsequent confirmatory PSA value 0.2 ng/mL or greater. Values obtained with different assay methods or kits cannot be used interchangeably. Results cannot be interpreted as absolute evidence of the presence or absence of malignant disease.  WBC 3.4 - 10.8 x10E3/uL 4.5 5.4 5.2 5.1     6.7 R  RBC 4.14 - 5.80 x10E6/uL 4.50 4.38 4.63 4.50     4.77 R  Hemoglobin 13.0 - 17.7 g/dL 13.3 13.4 13.6 13.3     13.7 R  Hematocrit 37.5 - 51.0 % 39.0 39.2 40.0 40.2     40.1 R  MCV 79 - 97 fL 87 90 86 89     84.2 R  MCH 26.6 - 33.0 pg 29.6 30.6 29.4 29.6     28.8 R  MCHC 31.5 - 35.7 g/dL 34.1 34.2 34.0 33.1     34.2 R  RDW 11.6 - 15.4 % 13.3 12.2 13.0 13.1     14.0 R  Platelets 150 - 450 x10E3/uL 205 187 213 205     194 R  Neutrophils Not Estab. % 46 58 50 55       Lymphs Not Estab. % 41 33 41 34       Monocytes Not Estab. % '9 7 8 9       '$ Eos Not Estab. % 2 1 0 1       Basos Not Estab. % '2 1 1 1       '$ Neutrophils Absolute 1.4 - 7.0 x10E3/uL 2.1 3.1 2.6 2.8       Lymphocytes Absolute 0.7 - 3.1 x10E3/uL 1.8 1.8 2.1 1.7       Monocytes Absolute 0.1 - 0.9 x10E3/uL 0.4 0.4 0.4 0.4       EOS (ABSOLUTE) 0.0 - 0.4 x10E3/uL 0.1 0.0 0.0 0.0       Basophils Absolute 0.0 - 0.2 x10E3/uL 0.1 0.1 0.0 0.1       Immature Granulocytes Not Estab. % 0 0 0 0                      ___________________________________________  EKG  Sinus bradycardia 54  bpm ____________________________________________    ____________________________________________   INITIAL IMPRESSION / ASSESSMENT AND PLAN As part of my medical decision making, I reviewed the following data within the electronic MEDICAL RECORD NUMBER      No acute findings on physical exam or EKG.  First concern for abrupt increase PSA to 5.4.  Will repeat PSA and free PSA.  Also discussed changing Pravachol to Crestor for better control of lipids.        ____________________________________________   FINAL CLINICAL IMPRESSION(S) / ED DIAGNOSES  Well exam   ED Discharge Orders     None        Note:  This document was prepared using Dragon voice recognition software and may include unintentional dictation errors.

## 2022-09-07 NOTE — Progress Notes (Signed)
Pt presents today to complete physical, Pt denies any issues or concerns at this time/CL,RMA 

## 2022-09-08 LAB — PSA, TOTAL AND FREE
PSA, Free Pct: 12.5 %
PSA, Free: 0.74 ng/mL
Prostate Specific Ag, Serum: 5.9 ng/mL — ABNORMAL HIGH (ref 0.0–4.0)

## 2022-09-08 NOTE — Addendum Note (Signed)
Addended by: Aliene Altes on: 09/08/2022 02:31 PM   Modules accepted: Orders

## 2022-09-28 ENCOUNTER — Other Ambulatory Visit: Payer: Self-pay

## 2022-09-28 NOTE — Telephone Encounter (Signed)
Rec'd Rx refill request for Pravastatin from [facility].  Medication D/C'd & patient switched to Rosuvastatin 40 my on 09/07/22 by Randel Pigg, PA-C.  Faxed form to [facility] notifying them of above info.  AMD

## 2023-08-30 DIAGNOSIS — L570 Actinic keratosis: Secondary | ICD-10-CM | POA: Diagnosis not present

## 2023-08-30 DIAGNOSIS — L738 Other specified follicular disorders: Secondary | ICD-10-CM | POA: Diagnosis not present

## 2023-08-30 DIAGNOSIS — Z85828 Personal history of other malignant neoplasm of skin: Secondary | ICD-10-CM | POA: Diagnosis not present

## 2023-08-30 DIAGNOSIS — L308 Other specified dermatitis: Secondary | ICD-10-CM | POA: Diagnosis not present

## 2023-08-30 DIAGNOSIS — D1801 Hemangioma of skin and subcutaneous tissue: Secondary | ICD-10-CM | POA: Diagnosis not present

## 2023-08-30 DIAGNOSIS — L812 Freckles: Secondary | ICD-10-CM | POA: Diagnosis not present

## 2023-08-30 DIAGNOSIS — L821 Other seborrheic keratosis: Secondary | ICD-10-CM | POA: Diagnosis not present

## 2023-08-30 DIAGNOSIS — L218 Other seborrheic dermatitis: Secondary | ICD-10-CM | POA: Diagnosis not present

## 2023-09-10 ENCOUNTER — Other Ambulatory Visit: Payer: Self-pay

## 2023-09-10 DIAGNOSIS — Z91030 Bee allergy status: Secondary | ICD-10-CM

## 2023-09-10 MED ORDER — EPINEPHRINE 0.3 MG/0.3ML IJ SOAJ: 0.3000 mg | INTRAMUSCULAR | 1 refills | Status: AC | PRN

## 2023-11-28 ENCOUNTER — Ambulatory Visit: Payer: Self-pay

## 2023-11-28 DIAGNOSIS — Z0000 Encounter for general adult medical examination without abnormal findings: Secondary | ICD-10-CM

## 2023-11-28 LAB — POCT URINALYSIS DIPSTICK
Bilirubin, UA: NEGATIVE
Blood, UA: NEGATIVE
Glucose, UA: NEGATIVE
Ketones, UA: NEGATIVE
Leukocytes, UA: NEGATIVE
Nitrite, UA: NEGATIVE
Protein, UA: NEGATIVE
Spec Grav, UA: 1.015 (ref 1.010–1.025)
Urobilinogen, UA: 0.2 U/dL
pH, UA: 6 (ref 5.0–8.0)

## 2023-11-29 LAB — CMP12+LP+TP+TSH+6AC+PSA+CBC…
ALT: 47 IU/L — ABNORMAL HIGH (ref 0–44)
AST: 35 IU/L (ref 0–40)
Albumin: 4.9 g/dL (ref 3.8–4.9)
Alkaline Phosphatase: 48 IU/L (ref 44–121)
BUN/Creatinine Ratio: 12 (ref 10–24)
BUN: 14 mg/dL (ref 8–27)
Basophils Absolute: 0.1 10*3/uL (ref 0.0–0.2)
Basos: 2 %
Bilirubin Total: 0.3 mg/dL (ref 0.0–1.2)
Calcium: 9.7 mg/dL (ref 8.6–10.2)
Chloride: 103 mmol/L (ref 96–106)
Chol/HDL Ratio: 3.9 ratio (ref 0.0–5.0)
Cholesterol, Total: 240 mg/dL — ABNORMAL HIGH (ref 100–199)
Creatinine, Ser: 1.19 mg/dL (ref 0.76–1.27)
EOS (ABSOLUTE): 0.1 10*3/uL (ref 0.0–0.4)
Eos: 2 %
Estimated CHD Risk: 0.7 times avg. (ref 0.0–1.0)
Free Thyroxine Index: 1.4 (ref 1.2–4.9)
GGT: 120 IU/L — ABNORMAL HIGH (ref 0–65)
Globulin, Total: 2.3 g/dL (ref 1.5–4.5)
Glucose: 98 mg/dL (ref 70–99)
HDL: 62 mg/dL (ref >39)
Hematocrit: 43 % (ref 37.5–51.0)
Hemoglobin: 13.9 g/dL (ref 13.0–17.7)
Immature Grans (Abs): 0 10*3/uL (ref 0.0–0.1)
Immature Granulocytes: 0 %
Iron: 82 ug/dL (ref 38–169)
LDH: 172 IU/L (ref 121–224)
LDL Chol Calc (NIH): 136 mg/dL — ABNORMAL HIGH (ref 0–99)
Lymphocytes Absolute: 1.8 10*3/uL (ref 0.7–3.1)
Lymphs: 38 %
MCH: 29.3 pg (ref 26.6–33.0)
MCHC: 32.3 g/dL (ref 31.5–35.7)
MCV: 91 fL (ref 79–97)
Monocytes Absolute: 0.4 10*3/uL (ref 0.1–0.9)
Monocytes: 8 %
Neutrophils Absolute: 2.4 10*3/uL (ref 1.4–7.0)
Neutrophils: 50 %
Phosphorus: 3.1 mg/dL (ref 2.8–4.1)
Platelets: 192 10*3/uL (ref 150–450)
Potassium: 3.9 mmol/L (ref 3.5–5.2)
Prostate Specific Ag, Serum: 2.2 ng/mL (ref 0.0–4.0)
RBC: 4.74 x10E6/uL (ref 4.14–5.80)
RDW: 13.1 % (ref 11.6–15.4)
Sodium: 138 mmol/L (ref 134–144)
T3 Uptake Ratio: 28 % (ref 24–39)
T4, Total: 4.9 ug/dL (ref 4.5–12.0)
TSH: 1.28 u[IU]/mL (ref 0.450–4.500)
Total Protein: 7.2 g/dL (ref 6.0–8.5)
Triglycerides: 236 mg/dL — ABNORMAL HIGH (ref 0–149)
Uric Acid: 9.1 mg/dL — ABNORMAL HIGH (ref 3.8–8.4)
VLDL Cholesterol Cal: 42 mg/dL — ABNORMAL HIGH (ref 5–40)
WBC: 4.7 10*3/uL (ref 3.4–10.8)
eGFR: 70 mL/min/{1.73_m2} (ref >59)

## 2023-12-05 ENCOUNTER — Encounter: Payer: Self-pay | Admitting: Physician Assistant

## 2023-12-05 ENCOUNTER — Ambulatory Visit: Payer: Self-pay | Admitting: Physician Assistant

## 2023-12-05 VITALS — BP 146/82 | HR 72 | Resp 14 | Ht 69.0 in | Wt 178.0 lb

## 2023-12-05 DIAGNOSIS — Z0000 Encounter for general adult medical examination without abnormal findings: Secondary | ICD-10-CM

## 2023-12-05 MED ORDER — DOCUSATE SODIUM 250 MG PO CAPS: 250.0000 mg | ORAL_CAPSULE | Freq: Every day | ORAL | 0 refills | Status: AC

## 2023-12-05 MED ORDER — ROSUVASTATIN CALCIUM 40 MG PO TABS: 40.0000 mg | ORAL_TABLET | Freq: Every day | ORAL | 3 refills | Status: AC

## 2023-12-05 NOTE — Progress Notes (Signed)
 Pt states he's bloated all the time over the past several days and last three nights have not ate dinner. Frann Ivans

## 2023-12-05 NOTE — Progress Notes (Signed)
 [facility] ____________________________________________   None    (approximate)  I have reviewed the triage vital signs and the nursing notes.   HISTORY  Chief Complaint No chief complaint on file.   HPI Bryan Mccann is a 58 y.o. male patient presents for annual physical exam.  Patient complaining of bloating sensation in abdomen.  States normal bowel movements.  Denies nausea vomiting or diarrhea.  Patient also admits to noncompliance of Crestor  for hyperlipidemia.       Past Medical History:  Diagnosis Date   Allergy    GERD (gastroesophageal reflux disease)     Patient Active Problem List   Diagnosis Date Noted   Cervico-occipital neuralgia 09/07/2022   Mechanical complication of nervous system device 09/07/2022   Numbness of tongue 10/01/2017   Bulging lumbar disc 06/05/2017   Unstable ankle 11/24/2016   Unstable angina ([facility]) 11/24/2016   Cervical post-laminectomy syndrome 03/23/2015   Heme positive stool 07/06/2014   History of heartburn 07/06/2014   Hip pain 11/03/2013   Displacement of lumbar intervertebral disc without myelopathy 10/06/2013   Chronic neck pain 07/08/2013   Cervical spondylosis without myelopathy 12/31/2012    Past Surgical History:  Procedure Laterality Date   CERVICAL DISCECTOMY     CERVICAL FUSION     HERNIA REPAIR      Prior to Admission medications   Medication Sig Start Date End Date Taking? Authorizing Provider  celecoxib (CELEBREX) 200 MG capsule Take 200 mg by mouth daily. 06/17/20   [provider]  EPINEPHrine  0.3 mg/0.3 mL IJ SOAJ injection Inject 0.3 mg into the muscle as needed for anaphylaxis. 09/10/23   Marcina Severe, PA-C  esomeprazole  (NEXIUM ) 20 MG capsule Take 1 capsule (20 mg total) by mouth daily. 06/28/22   Marcina Severe, PA-C  fluticasone (FLONASE) 50 MCG/ACT nasal spray once daily. 06/15/14   [provider]  HYDROcodone -acetaminophen  (NORCO)  10-325 MG tablet Take 1 tablet by mouth every 6 (six) hours as needed. 10/30/16   [provider]  methylPREDNISolone  (MEDROL  DOSEPAK) 4 MG TBPK tablet Take Tapered dose as directed 06/05/22   Marcina Severe, PA-C  naloxone [facility]) nasal spray 4 mg/0.1 mL Place into the nose. 06/28/22   [provider]  Oxycodone HCl 10 MG TABS Take 10 mg by mouth. 08/11/22   [provider]  pregabalin  (LYRICA ) 200 MG capsule Take 200 mg by mouth in the morning, at noon, and at bedtime. 06/17/20   [provider]  rosuvastatin  (CRESTOR ) 40 MG tablet Take 1 tablet (40 mg total) by mouth daily. 09/07/22   Marcina Severe, PA-C  topiramate (TOPAMAX) 50 MG tablet Take 50 mg by mouth 2 (two) times daily. 06/13/21   [provider]  triamcinolone cream (KENALOG) 0.1 % Apply 1 Application topically as needed. 08/07/22   [provider]  XTAMPZA ER 9 MG C12A Take 1 capsule by mouth every 12 (twelve) hours.    [provider]    Allergies Bee venom  Family History  Problem Relation Age of Onset   Heart disease Mother    Hypertension Mother    Skin cancer Mother    Diabetes Father    Skin cancer Maternal Grandmother     Social History Social History   Tobacco Use   Smoking status: Former   Smokeless tobacco: Never    Review of Systems Constitutional: No fever/chills Eyes: No visual changes. ENT: No sore throat. Cardiovascular: Denies chest pain.  Respiratory: Denies shortness of breath. Gastrointestinal:  abdominal pain.  No nausea, no vomiting.  No diarrhea.  No constipation. Genitourinary: Negative for dysuria. Musculoskeletal: Negative for back pain. Skin: Negative for rash. Neurological: Negative for headaches, focal weakness or numbness.  Endocrine: Hyperlipidemia Allergic/Immunilogical: Bee venom ____________________________________________   PHYSICAL EXAM:  VITAL SIGNS: BP 146/82BP. 146/82. Data is abnormal. Taken on 12/05/23 8:09  AM  Cuff Size Normal  Pulse Rate 72  Weight 178 lb (80.7 kg)  Height 5' 9 (1.753 m)  Resp 14  SpO2 97 %   BMI: 26.29 kg/m2  BSA: 1.98 m2   Constitutional: Alert and oriented. Well appearing and in no acute distress. Eyes: Conjunctivae are normal. PERRL. EOMI. Head: Atraumatic. Nose: No congestion/rhinnorhea. Mouth/Throat: Mucous membranes are moist.  Oropharynx non-erythematous. Neck: No stridor.  No cervical spine tenderness to palpation. Hematological/Lymphatic/Immunilogical: No cervical lymphadenopathy. Cardiovascular: Normal rate, regular rhythm. Grossly normal heart sounds.  Good peripheral circulation. Respiratory: Normal respiratory effort.  No retractions. Lungs CTAB. Gastrointestinal: Soft and nontender. No distention. No abdominal bruits.  Decreased bowel sounds left lower quadrant.  No CVA tenderness. Genitourinary: Deferred Neurologic:  Normal speech and language. No gross focal neurologic deficits are appreciated. No gait instability. Skin:  Skin is warm, dry and intact. No rash noted. Psychiatric: Mood and affect are normal. Speech and behavior are normal.  ____________________________________________   LABS         Component Ref Range & Units (hover) 7 d ago 1 yr ago 2 yr ago 3 yr ago 4 yr ago  Color, UA yellow yellow Light Yellow Light Yellow Light Yellow  Clarity, UA clear clear Clear Clear Clear  Glucose, UA Negative Negative Negative Negative Negative  Bilirubin, UA neg neg Negative Negative Negative  Ketones, UA neg neg Negative Negative Negative  Spec Grav, UA 1.015 1.025 1.025 1.025 1.025  Blood, UA neg neg Negative Negative Negative  pH, UA 6.0 5.0 6.0 6.0 6.0  Protein, UA Negative Negative Negative Negative Negative  Urobilinogen, UA 0.2 0.2 0.2 0.2 0.2  Nitrite, UA neg neg Negative Negative Negative  Leukocytes, UA Negative Negative Negative Negative Negative  Appearance  dark     Odor  none                   Component Ref Range & Units  (hover) 7 d ago (11/28/23) 1 yr ago (09/07/22) 1 yr ago (08/31/22) 2 yr ago (07/18/21) 3 yr ago (07/08/20) 4 yr ago (07/16/19) 7 yr ago (11/25/16)  Glucose 98  97 88 103 High  R 106 High  R   Uric Acid 9.1 High   9.0 High  CM 6.5 CM 8.0 CM 9.5 High  CM   Comment:            Therapeutic target for gout patients: <6.0  BUN 14  14 R 15 R 13 R 14 R   Creatinine, Ser 1.19  1.21 1.25 1.02 1.14   eGFR 70  69 67     BUN/Creatinine Ratio 12  12 R 12 R 13 R 12 R   Sodium 138  143 142 141 142   Potassium 3.9  4.0 4.4 4.1 4.2   Chloride 103  106 104 106 104   Calcium  9.7  9.6 R 9.0 R 9.7 R 9.7 R   Phosphorus 3.1  3.5 3.5 3.1 3.2   Total Protein 7.2  7.1 7.0 7.4 7.0   Albumin 4.9  4.8 4.1 4.8 4.9   Globulin, Total  2.3  2.3 2.9 2.6 2.1   Bilirubin Total 0.3  0.5 0.4 0.5 0.5   Alkaline Phosphatase 48  55 84 49 CM 38 Low  R   LDH 172  160 86 Low  167 150   AST 35  24 37 24 25   ALT 47 High   32 35 40 35   GGT 120 High   61 24 146 High  83 High    Iron 82  97 91 119 115   Cholesterol, Total 240 High   205 High  171 231 High  218 High    Triglycerides 236 High   266 High  160 High  184 High  202 High  341 High  R  HDL 62  63 41 64 67 66 R  VLDL Cholesterol Cal 42 High   44 High  28 32 35   LDL Chol Calc (NIH) 136 High   98 102 High  135 High  116 High    Chol/HDL Ratio 3.9  3.3 CM 4.2 CM 3.6 CM 3.3 CM 3.6 R  Comment:                                   T. Chol/HDL Ratio                                             Men  Women                               1/2 Avg.Risk  3.4    3.3                                   Avg.Risk  5.0    4.4                                2X Avg.Risk  9.6    7.1                                3X Avg.Risk 23.4   11.0  Estimated CHD Risk 0.7   < 0.5 CM 0.8 CM 0.6 CM  < 0.5 CM   Comment: The CHD Risk is based on the T. Chol/HDL ratio. Other factors affect CHD Risk such as hypertension, smoking, diabetes, severe obesity, and family history of premature CHD.  TSH 1.280  1.970  1.180 1.360 1.070   T4, Total 4.9  4.6 7.7 5.4 4.1 Low    T3 Uptake Ratio 28  26 22  Low  32 27   Free Thyroxine Index 1.4  1.2 1.7 1.7 1.1 Low    Prostate Specific Ag, Serum 2.2 5.9 High  CM 5.3 High  CM 0.6 CM 0.5 CM 0.7 CM   Comment: Roche ECLIA methodology. According to the American Urological Association, Serum PSA should decrease and remain at undetectable levels after radical prostatectomy. The AUA defines biochemical recurrence as an initial PSA value 0.2 ng/mL or greater followed by a subsequent confirmatory PSA value 0.2 ng/mL or greater. Values obtained with different assay methods or  kits cannot be used interchangeably. Results cannot be interpreted as absolute evidence of the presence or absence of malignant disease.  WBC 4.7  4.5 5.4 5.2 5.1   RBC 4.74  4.50 4.38 4.63 4.50   Hemoglobin 13.9  13.3 13.4 13.6 13.3   Hematocrit 43.0  39.0 39.2 40.0 40.2   MCV 91  87 90 86 89   MCH 29.3  29.6 30.6 29.4 29.6   MCHC 32.3  34.1 34.2 34.0 33.1   RDW 13.1  13.3 12.2 13.0 13.1   Platelets 192  205 187 213 205   Neutrophils 50  46 58 50 55   Lymphs 38  41 33 41 34   Monocytes 8  9 7 8 9    Eos 2  2 1  0 1   Basos 2  2 1 1 1    Neutrophils Absolute 2.4  2.1 3.1 2.6 2.8   Lymphocytes Absolute 1.8  1.8 1.8 2.1 1.7   Monocytes Absolute 0.4  0.4 0.4 0.4 0.4   EOS (ABSOLUTE) 0.1  0.1 0.0 0.0 0.0   Basophils Absolute 0.1  0.1 0.1 0.0 0.1   Immature Granulocytes 0  0 0 0 0   Immature Grans (Abs) 0.0  0.0 0.0 0.0 0.0                   ____________________________________________  EKG  Bradycardic at 53 bpm ____________________________________________    ____________________________________________   INITIAL IMPRESSION / ASSESSMENT AND PLAN As part of my medical decision making, I reviewed the following data within the electronic MEDICAL RECORD NUMBER       No acute findings on physical exam except for decreased bowel sounds left lower quadrant.  No acute findings on EKG.  Labs  continue to show elevated lipid profile secondary to noncompliance.  Patient given prescription laxative and advised to restart Crestor .  Patient will follow-up in 5 days if no improvement or worsening of complaint.      ____________________________________________   FINAL CLINICAL IMPRESSION    ED Discharge Orders     None        Note:  This document was prepared using Dragon voice recognition software and may include unintentional dictation errors.

## 2023-12-10 ENCOUNTER — Ambulatory Visit: Admitting: Physician Assistant

## 2023-12-12 ENCOUNTER — Ambulatory Visit: Payer: Self-pay | Admitting: Physician Assistant

## 2023-12-12 ENCOUNTER — Ambulatory Visit
Admission: RE | Admit: 2023-12-12 | Discharge: 2023-12-12 | Disposition: A | Discharge status: Home / Self Care | Attending: Physician Assistant | Admitting: Physician Assistant

## 2023-12-12 ENCOUNTER — Ambulatory Visit
Admission: RE | Admit: 2023-12-12 | Discharge: 2023-12-12 | Disposition: A | Discharge status: Home / Self Care | Source: Ambulatory Visit | Attending: Physician Assistant | Admitting: Physician Assistant

## 2023-12-12 ENCOUNTER — Encounter: Payer: Self-pay | Admitting: Physician Assistant

## 2023-12-12 VITALS — BP 128/85 | HR 60 | Temp 97.3°F | Resp 14

## 2023-12-12 DIAGNOSIS — R140 Abdominal distension (gaseous): Secondary | ICD-10-CM

## 2023-12-12 DIAGNOSIS — Z9682 Presence of neurostimulator: Secondary | ICD-10-CM | POA: Diagnosis not present

## 2023-12-12 NOTE — Progress Notes (Signed)
 Here to follow up on constipation and using the Colace without much effectiveness he stated.  Reports a small/med ineffective BM usually daily with bloating.  Stated not using Nexium  right now and needs to get more or switch to something else.

## 2023-12-12 NOTE — Progress Notes (Signed)
   Subjective: Abdominal bloating    Patient ID: Bryan Mccann, male    DOB: 1966-02-05, 58 y.o.   MRN: [id number]  HPI Patient follow-up for abdominal bloating.  Patient was seen in his physical exam 1 week ago with this complaint.  Denies nausea vomiting diarrhea.  Admits to passing small amount of stools.  Patient states decreased appetite secondary to the distention.  Patient has not taken stool softener as directed.   Review of Systems GERD    Objective:   Physical Exam BP 128/85  Pulse Rate 60  Temp 97.3 F (36.3 C)  Resp 14  SpO2 96 %  Negative HSM.  Decreased bowel sounds left lateral abnormal appointment of the abdomen.  Nontender to palpation.       Assessment & Plan: Abdominal bloating  Advised patient to continue stool softener and he will get a KUB and follow-up on 12/17/2023.

## 2023-12-17 ENCOUNTER — Ambulatory Visit: Payer: Self-pay | Admitting: Physician Assistant

## 2023-12-17 VITALS — BP 126/78 | HR 61 | Temp 97.8°F | Resp 16

## 2023-12-17 DIAGNOSIS — R140 Abdominal distension (gaseous): Secondary | ICD-10-CM

## 2023-12-17 NOTE — Progress Notes (Signed)
 Follow up post abdominal xrays and con't c/o constipation.  Stated some improvement and reports last BM today and not daily, but reported it has improved.

## 2023-12-17 NOTE — Progress Notes (Signed)
   Subjective: Abdominal distention    Patient ID: Bryan Mccann, male    DOB: 04-02-1966, 58 y.o.   MRN: [id number]  HPI Patient is follow-up extremity:.  Abdominal distention.  Patient admits to changes in stool pattern.  Patient started his stool softener 4 days ago and noticed decreased pain and distention.  States bowel movements are still intermittent.  KUB shows left stool burden..   Review of Systems Hyperlipidemia    Objective:   Physical Exam BP 126/78  Pulse Rate 61  Temp 97.8 F (36.6 C)  Resp 16  SpO2 96 %  No obvious abdominal distention at this time.  Increased bowel except for left descending colon.  KUB pending.  Advised patient to continue stool softener and a KUB.  Will follow-up telephonically.       Assessment & Plan: Abdominal distention/constipation   Advised to continue stool softener and I will follow-up with patient in 24 hours.
